# Patient Record
Sex: Female | Born: 1995 | Race: Asian | Hispanic: No | Marital: Single | Smoking: Never smoker
Health system: Southern US, Community
[De-identification: ages and names within clinical notes are randomized; demographics above are authoritative.]

## PROBLEM LIST (undated history)

## (undated) ENCOUNTER — Ambulatory Visit (HOSPITAL_COMMUNITY): Admission: EM | Discharge status: Absent without leave | Payer: Self-pay

## (undated) DIAGNOSIS — Z789 Other specified health status: Secondary | ICD-10-CM

## (undated) DIAGNOSIS — T7840XA Allergy, unspecified, initial encounter: Secondary | ICD-10-CM

## (undated) HISTORY — DX: Allergy, unspecified, initial encounter: T78.40XA

## (undated) HISTORY — PX: NO PAST SURGERIES: SHX2092

---
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
## 2017-07-18 ENCOUNTER — Other Ambulatory Visit: Payer: Self-pay | Admitting: Internal Medicine

## 2017-07-18 ENCOUNTER — Ambulatory Visit
Admission: RE | Admit: 2017-07-18 | Discharge: 2017-07-18 | Disposition: A | Discharge status: Home / Self Care | Payer: BLUE CROSS/BLUE SHIELD | Source: Ambulatory Visit | Attending: Internal Medicine | Admitting: Internal Medicine

## 2017-07-18 DIAGNOSIS — A159 Respiratory tuberculosis unspecified: Secondary | ICD-10-CM

## 2017-10-15 ENCOUNTER — Ambulatory Visit: Payer: BLUE CROSS/BLUE SHIELD | Admitting: Emergency Medicine

## 2017-10-15 ENCOUNTER — Ambulatory Visit: Payer: BLUE CROSS/BLUE SHIELD | Admitting: Urgent Care

## 2017-10-15 ENCOUNTER — Encounter: Payer: Self-pay | Admitting: Urgent Care

## 2017-10-15 VITALS — BP 98/62 | HR 79 | Ht 60.25 in | Wt 89.6 lb

## 2017-10-15 DIAGNOSIS — B354 Tinea corporis: Secondary | ICD-10-CM | POA: Diagnosis not present

## 2017-10-15 DIAGNOSIS — L299 Pruritus, unspecified: Secondary | ICD-10-CM | POA: Diagnosis not present

## 2017-10-15 MED ORDER — FLUCONAZOLE 150 MG PO TABS: 150.0000 mg | ORAL_TABLET | ORAL | 0 refills | Status: DC

## 2017-10-15 MED ORDER — CLOTRIMAZOLE-BETAMETHASONE 1-0.05 % EX CREA: 1.0000 "application " | TOPICAL_CREAM | Freq: Two times a day (BID) | CUTANEOUS | 0 refills | Status: DC

## 2017-10-15 NOTE — Patient Instructions (Signed)
Use the cream for 1 week. Take the oral medication once per week for 6 weeks. Please call and let me know if your rash does not get better. I will send you to a skin doctor in this case.     Body Ringworm Body ringworm is an infection of the skin that often causes a ring-shaped rash. Body ringworm can affect any part of your skin. It can spread easily to others. Body ringworm is also called tinea corporis. What are the causes? This condition is caused by funguses called dermatophytes. The condition develops when these funguses grow out of control on the skin. You can get this condition if you touch a person or animal that has it. You can also get it if you share clothing, bedding, towels, or any other object with an infected person or pet. What increases the risk? This condition is more likely to develop in:  Athletes who often make skin-to-skin contact with other athletes, such as wrestlers.  People who share equipment and mats.  People with a weakened immune system.  What are the signs or symptoms? Symptoms of this condition include:  Itchy, raised red spots and bumps.  Red scaly patches.  A ring-shaped rash. The rash may have: ? A clear center. ? Scales or red bumps at its center. ? Redness near its borders. ? Dry and scaly skin on or around it.  How is this diagnosed? This condition can usually be diagnosed with a skin exam. A skin scraping may be taken from the affected area and examined under a microscope to see if the fungus is present. How is this treated? This condition may be treated with:  An antifungal cream or ointment.  An antifungal shampoo.  Antifungal medicines. These may be prescribed if your ringworm is severe, keeps coming back, or lasts a long time.  Follow these instructions at home:  Take over-the-counter and prescription medicines only as told by your health care provider.  If you were given an antifungal cream or ointment: ? Use it as told by  your health care provider. ? Wash the infected area and dry it completely before applying the cream or ointment.  If you were given an antifungal shampoo: ? Use it as told by your health care provider. ? Leave the shampoo on your body for 3-5 minutes before rinsing.  While you have a rash: ? Wear loose clothing to stop clothes from rubbing and irritating it. ? Wash or change your bed sheets every night.  If your pet has the same infection, take your pet to see a International aid/development workerveterinarian. How is this prevented?  Practice good hygiene.  Wear sandals or shoes in public places and showers.  Do not share personal items with others.  Avoid touching red patches of skin on other people.  Avoid touching pets that have bald spots.  If you touch an animal that has a bald spot, wash your hands. Contact a health care provider if:  Your rash continues to spread after 7 days of treatment.  Your rash is not gone in 4 weeks.  The area around your rash gets red, warm, tender, and swollen. This information is not intended to replace advice given to you by your health care provider. Make sure you discuss any questions you have with your health care provider. Document Released: 10/19/2000 Document Revised: 03/29/2016 Document Reviewed: 08/18/2015 Elsevier Interactive Patient Education  [employer]2018 Elsevier Inc.

## 2017-10-15 NOTE — Progress Notes (Signed)
  MRN: [id number] DOB: 02/02/1996  Subjective:   Tricia Wood is a 21 y.o. female presenting for 2 month history of recurrent itchy rash over neck, arms, torso, lower legs. Patient used an otc antifungal cream with some relief but not resolution of her symptoms. Denies fever, pain, drainage of pus or bleeding. Has a history of tinea corporis, treated with antifungal medications which resolved her rash.    Tricia Wood has a current medication list which includes the following prescription(s): rifampin. Also is allergic to chicken allergy and shrimp [shellfish allergy].  Tricia Wood  has a past medical history of Allergy. Also  has no past surgical history on file.  Objective:   Vitals: BP 98/62 (BP Location: Left Arm, Patient Position: Sitting, Cuff Size: Normal)   Pulse 79   Ht 5' 0.25" (1.53 m)   Wt 89 lb 9.6 oz (40.6 kg)   SpO2 98%   BMI 17.35 kg/m   Physical Exam  Constitutional: She is oriented to person, place, and time. She appears well-developed and well-nourished.  HENT:  Mouth/Throat: Oropharynx is clear and moist.  Cardiovascular: Normal rate.  Pulmonary/Chest: Effort normal.  Neurological: She is alert and oriented to person, place, and time.  Skin: Skin is warm and dry. Rash (multiple dry hyperpigmented scaly lesions without central clearing over wrists, forearms, anterior neck, lower legs) noted.  Psychiatric: She has a normal mood and affect.    Assessment and Plan :   1. Tinea corporis 2. Itching - Will cover for fungal infection with fluconazole, clotrimazole-betamethasone. Patient instructed to use cream for 1 week, oral treatment for 6 weeks, once weekly. Return-to-clinic precautions discussed, patient verbalized understanding. She is to let me know if this does not resolve her rash, will pursue referral to derm at that point. Patient is in agreement with our treatment plan.  Tricia BambergMario Else Habermann, PA-C Primary Care at [facility] [phone] 10/15/2017  11:33 AM

## 2017-12-31 ENCOUNTER — Ambulatory Visit: Payer: BLUE CROSS/BLUE SHIELD | Admitting: Urgent Care

## 2017-12-31 ENCOUNTER — Encounter: Payer: Self-pay | Admitting: Urgent Care

## 2017-12-31 VITALS — BP 106/74 | HR 67 | Temp 98.2°F | Resp 16 | Ht 60.0 in | Wt 86.4 lb

## 2017-12-31 DIAGNOSIS — R21 Rash and other nonspecific skin eruption: Secondary | ICD-10-CM

## 2017-12-31 DIAGNOSIS — L299 Pruritus, unspecified: Secondary | ICD-10-CM | POA: Diagnosis not present

## 2017-12-31 MED ORDER — HYDROXYZINE PAMOATE 25 MG PO CAPS: 25.0000 mg | ORAL_CAPSULE | Freq: Three times a day (TID) | ORAL | 1 refills | Status: DC | PRN

## 2017-12-31 NOTE — Progress Notes (Signed)
  MRN: [id number] DOB: 1996-07-01  Subjective:   Tricia Wood is a 22 y.o. female presenting for recurrent rash. Has been to our clinic twice previously for similar rash. Has been treated with partial successful for fungal infection. Today, reports 4 month history of very itchy rash over same areas including lower legs, torso, arms, back. She has finished course of diflucan, clotrimazole-betamethasone and still has this rash. Denies that it is worse but not any better. Denies smoking cigarettes or drinking alcohol.   Tricia Wood has a current medication list which includes the following prescription(s): clotrimazole-betamethasone, fluconazole, and rifampin. Also is allergic to chicken allergy and shrimp [shellfish allergy].  Tricia Wood  has a past medical history of Allergy. Denies past surgical history.  Objective:   Vitals: BP 106/74   Pulse 67   Temp 98.2 F (36.8 C) (Oral)   Resp 16   Ht 5' (1.524 m)   Wt 86 lb 6.4 oz (39.2 kg)   SpO2 98%   BMI 16.87 kg/m   Physical Exam  Constitutional: She is oriented to person, place, and time. She appears well-developed and well-nourished.  HENT:  Mouth/Throat: Oropharynx is clear and moist.  Eyes: No scleral icterus.  Cardiovascular: Normal rate.  Pulmonary/Chest: Effort normal.  Neurological: She is alert and oriented to person, place, and time.  Skin: Rash (multiple patches of dry scaly hyperpigmentation of varying sizes from 1/2cm-2cm over right lower leg, upper extremities, torso and back; there are also multiple associated excoriations from her itching) noted.  Psychiatric: She has a normal mood and affect.   Assessment and Plan :   Rash and nonspecific skin eruption - Plan: Ambulatory referral to Dermatology  Itching - Plan: Ambulatory referral to Dermatology  Will refer to dermatology for consult. In the meantime, start hydroxyzine to help with itching.   Tricia BambergMario Herberto Ledwell, PA-C Primary Care at [facility] [phone] 12/31/2017   9:25 AM

## 2017-12-31 NOTE — Patient Instructions (Addendum)
Pht ban Rash Pht ban l s? thay ??i mu s?c c?a da. Pht ban c?ng c th? lm thay ??i c?m gic da. C nhi?u tnh tr?ng b?nh l v y?u t? khc nhau c th? gy pht ban. Tun th? nh?ng h??ng d?n ny ? nh: Ch  ??n b?t c? thay ??i no v? tri?u ch?ng c?a qu v?. Lm theo nh?ng h??ng d?n sau ?? gip c?i thi?n tnh tr?ng c?a qu v?: Thu?c Ch? dng ho?c bi thu?c khng k ??n v thu?c k ??n theo ch? d?n c?a chuyn gia ch?m [place] s?c kh?e. Nh?ng thu?c ny c th? bao g?m:  Thu?c corticosteroid d?ng kem.  Thu?c d?ng kem l?ng ch?ng ng?a.  Thu?c khng histamine ???ng u?ng.  Ch?m so?c da  Ch??m mt vo vng b? ?nh h??ng.  Th? t?m b?ng: ? Mu?i [place]. Lm theo nh?ng ch? d?n trn bao b. Qu v? c th? mua mu?i ny t?i nh thu?c ho?c c?a hng t?p ha ? ??a ph??ng. ? Thu?c mu?i. ?? m?t l??ng nh? vo b?n t?m theo ch? d?n c?a chuyn gia ch?m [place] s?c kh?e. ? Keo b?t y?n m?ch. Lm theo nh?ng ch? d?n trn bao b. Qu v? c th? mua t?i nh thu?c ho?c c?a hng t?p ha ? ??a ph??ng.  Th? bi thu?c mu?i d?ng b?t nho ln da qu v?. Tr?n n??c vo thu?c mu?i cho ??n khi thnh b?t nho.  Khng gi ho?c ch xt vo da qu v?.  Trnh che kn ch? pht ban. ??m b?o pht ban ti?p xc v?i khng kh cng nhi?u cng t?t. H??ng d?n chung  Trnh t?m vi hoa sen ho?c t?m b?n n??c nng v c th? khi?n tnh tr?ng ng?a t? h?n. T?m vi hoa sen n??c l?nh c th? c tc d?ng.  Trnh x bng, ch?t t?y r?a v n??c hoa c mi th?m. S? d?ng x bng, ch?t t?y r?a, n??c hoa v cc s?n ph?m m? ph?m khc thu?c lo?i nh?.  Trnh b?t k? ch?t no gy pht ban. Ghi nh?t k ?? giu?p quy? vi? theo di nh?ng nguyn nhn gy pht ban. Ghi l?i: ? Qu v? ?n g. ? Qu v? s? d?ng m? ph?m no. ? Qu v? u?ng g. ? Qu v? m?c ?? g. ?i?u ny bao g?m c? vi?c ?eo trang s?c.  Tun th? t?t c? cc l?n khm theo di theo ch? d?n c?a chuyn gia ch?m [place] s?c kh?e. ?i?u ny c vai tr quan tr?ng. Hy lin l?c v?i chuyn gia ch?m [place] s?c kh?e  n?u:  Qu v? ?? m? hi ban ?m.  Qu v? b? s?t cn.  Qu v? ?i ti?u nhi?u h?n bnh th??ng.  Qu v? c?m th?y y?u ?t.  Qu v? nn.  Da ho?c trng tr?ng m?t c?a qu v? c mu vng (b?nh vng da).  Da c?a Ladell Heads v?: ? ?au bu?t. ? T b.  Ch? pht ban c?a qu v?: ? Khng h?t sau vi ngy. ? Tr? nn tr?m tr?ng h?n.  Qu v?: ? Kht n??c b?t th??ng. ? M?t m?i nhi?u h?n bnh th??ng.  Qu v? bi?: ? Tri?u ch?ng m?i. ? ?au b?ng. ? S?t. ? Tiu ch?y. Yu c?u tr? gip ngay l?p t?c n?u:  Qu v? b? pht ban bao ph? ton b? ho?c g?n nh? ton b? c? th?. Pht ban c th? ?au ho?c khng ?au.  Qu v? c cc m?n n??c: ? N?m trn pht ban. ? To ra v t?o thnh c?m. ? ?au. ?  Bn trong mi?ng v m?i.  Qu v? b? pht ban: ? Trng gi?ng nh? cc ??m mu ?? ta c? b?ng v?t chm kim trn kh?p c? th?. ? C m?t "?i?m ?en" ho?c trng nh? m?t hnh bia. ? Khng lin quan ??n vi?c ph?i n?ng, ?? v ?au v khi?n da b? trc. Thng tin ny khng nh?m m?c ?ch thay th? cho l?i khuyn m chuyn gia ch?m [place] s?c kh?e ni v?i qu v?. Hy b?o ??m qu v? ph?i th?o lu?n b?t k? v?n ?? g m qu v? c v?i chuyn gia ch?m [place] s?c kh?e c?a qu v?. Document Released: 01/14/2012 Document Revised: 02/07/2017 Document Reviewed: 03/09/2015 Elsevier Interactive Patient Education  2018 Elsevier Inc.     Hydroxyzine capsules or tablets ?y l thu?c g? HYDROXYZINE l thu?c khng histamine. Thu?c ny ???c dng ?? ?i?u tr? cc tri?u ch?ng d? ?ng. N c?ng ???c dng ?? ?i?u tr? tnh tr?ng lo u v c?ng th?ng. Thu?c ny c th? ???c dng cng v?i cc thu?c khc ?? gy ng? tr??c khi gi?i ph?u. Thu?c ny c th? ???c dng cho nh?ng m?c ?ch khc; hy h?i ng??i cung c?p d?ch v? y t? ho?c d??c s? c?a mnh, n?u qu v? c th?c m?c. (CC) NHN HI?U PH? BI?N: ANX, Atarax, Rezine, Vistaril Ti c?n ph?i bo cho ng??i cung c?p d?ch v? y t? c?a mnh ?i?u g tr??c khi dng thu?c ny? H? c?n bi?t li?u qu v? hi?n c b?t k? tnh tr?ng no sau  ?y hay khng: -b?t k? b?nh mn tnh no -kh ?i ti?u -b?nh t?ng nhn p -b?nh tim -b?nh th?n -b?nh gan -b?nh ph?i -ph?n ?ng b?t th??ng ho?c d? ?ng v??i hydroxyzine ho?c cetirizine -pha?n ??ng b?t th???ng ho??c di? ??ng v??i ca?c d??c ph?m kha?c, th?c ph?m, thu?c nhu?m, ho??c ch?t ba?o qua?n -?ang c thai ho??c ??nh co? thai -?ang cho con bu? Ti nn s? d?ng thu?c ny nh? th? no? U?ng thu?c ny v?i m?t [place] n??c ??y. Hy lm theo cc h??ng d?n trn h?p thu?c ho?c nhn thu?c. Qu v? c th? u?ng thu?c ny cng v?i th?c ?n ho?c khi bao t? ?ang tr?ng. Dng thu?c ny vo nh?ng kho?ng th?i gian ??u nhau. Khng ???c dng thu?c ny nhi?u l?n h?n ? ???c ch? d?n. Hy bn v?i bc s? nhi khoa c?a qu v? v? vi?c dng thu?c ny ? tr? em. C th? c?n ch?m [place] ??c bi?t. Thu?c ny c th? ???c k toa cho tr? em ch? m?i 6 tu?i trong nh?ng tr??ng h?p ch?n l?c, nh?ng c?n ph?i th?n tr?ng. Nh?ng b?nh nhn trn 65 tu?i c th? c ph?n ?ng m?nh h?n v?i thu?c ny v c?n nh?ng l??ng phun nh? h?n. Qu li?u: N?u qu v? cho r?ng mnh ? dng qu nhi?u thu?c ny, th hy lin l?c v?i trung tm ki?m sot ch?t ??c ho?c phng c?p c?u ngay l?p t?c. L?U : Thu?c ny ch? dnh ring cho qu v?. Khng chia s? thu?c ny v?i nh?ng ng??i khc. N?u ti l? qun m?t li?u th sao? N?u qu v? l? qun m?t li?u thu?c, hy dng li?u thu?c ? ngay khi c th?. N?u h?u nh? ? ??n gi? dng li?u thu?c k? ti?p, th ch? dng li?u thu?c k? ti?p ?Marland Kitchen Khng ???c dng li?u g?p ?i ho?c dng thm li?u. Nh?ng g c th? t??ng tc v?i thu?c ny? -r??u -cc thu?c nhm barbiturate dng ?? gy ng? ho?c ?? ?i?u tr? cc ch?ng co gi?t -cc thu?c  dng cho cc ch?ng c?m l?nh ho?c d? ?ng -cc thu?c dng cho ch?ng tr?m c?m, ch?ng lo u, ho?c cc r?i nhi?u c?m xc -cc thu?c gi?m ?au -cc thu?c ng? -cc thu?c lm gin c? Danh sch ny c th? khng m t? ?? h?t cc t??ng tc c th? x?y ra. Hy ??a cho ng??i cung c?p d?ch v? y t? c?a mnh danh sch t?t c? cc  thu?c, th?o d??c, cc thu?c khng c?n toa, ho?c cc ch? ph?m b? sung m qu v? dng. C?ng nn bo cho h? bi?t r?ng qu v? c ht thu?c, u?ng r??u, ho?c c s? d?ng ma ty tri php hay khng. Vi th? c th? t??ng tc v?i thu?c c?a qu v?. Ti c?n ph?i theo di ?i?u g trong khi dng thu?c ny? Hy bo cho bc s? ho?c chuyn vin y t?, n?u cc tri?u ch?ng c?a mnh khng kh h?n. Qu v? c th? b? bu?n ng? ho?c chng m?t. Khng ???c li xe, s? d?ng my mc, ho?c lm nh?ng vi?c c?n ph?i t?nh to cho t?i khi qu v? bi?t ???c thu?c ny ?nh h??ng ln qu v? nh? th? no. Khng ???c ng?i d?y ho?c ??ng d?y nhanh, ??c bi?t l khi qu v? l b?nh nhn l?n tu?i. ?i?u ny lm gi?m nguy c? b? chng m?t ho?c ng?t x?u. R??u c th? c?n tr? tc d?ng c?a thu?c ny. Hy trnh cc ?? u?ng c r??u. Qu v? c th? b? kh mi?ng. Nhai k?o cao su (chewing gum) khng c ???ng ho?c ng?m k?o c?ng, v u?ng nhi?u n??c c th? c ch. Hy lin l?c v?i bc s?, n?u v?n ?? nghim tr?ng ho?c khng ch?m d?t. Thu?c ny c th? gy kh m?t v nhn m?. N?u qu v? mang knh p trng, th qu v? c th? c?m th?y kh ch?u ?i cht. Thu?c bi tr?n d?ng nh? gi?t c th? c ch. Hy ??n g?p bc s?, n?u v?n ?? nghim tr?ng ho?c khng ch?m d?t. N?u qu v? s?p ???c lm xt nghi?m d? ?ng trn da, th hy bo cho bc s? bi?t r?ng qu v? ?ang dng thu?c ny. Ti c th? nh?n th?y nh?ng tc d?ng ph? no khi dng thu?c ny? Nh?ng tc d?ng ph? qu v? c?n ph?i bo cho bc s? ho?c chuyn vin y t? cng s?m cng t?t: -tim ??p nhanh ho?c khng ??u -kh ?i ti?u -co gi?t -ni lu gi?ng ho?c l l?n -run r?y Cc tc d?ng ph? khng c?n ph?i ch?m [place] y t? (hy bo cho bc s? ho?c chuyn vin y t?, n?u cc tc d?ng ph? ny ti?p di?n ho?c gy phi?n toi): -to bn -bu?n ng? -m?t m?i -?au ??u -kh ch?u ? bao t? Danh sch ny c th? khng m t? ?? h?t cc tc d?ng ph? c th? x?y ra. Xin g?i t?i bc s? c?a mnh ?? ???c c? v?n chuyn mn v? cc tc d?ng ph?Ladell Heads v? c th?  t??ng trnh cc tc d?ng ph? cho FDA theo s? [phone]. Ti nn c?t gi? thu?c c?a mnh ? ?u? ?? ngoi t?m tay tr? em. C?t gi? ? nhi?t ?? phng t? 15 ??n 30 ?? C (61 ??n 88 ?? F). ?ng ch?t gi/h?p thu?c. V?t b? t?t c? thu?c ch?a dng sau ngy h?t h?n in trn nhn thu?c ho?c bao thu?c. L?U : ?y l b?n tm t?t. N c th? khng bao hm t?t c? thng tin c th? c. N?u qu v? th?c m?c v?  thu?c ny, xin trao ??i v?i bc s?, d??c s?, ho?c ng??i cung c?p d?ch v? y t? c?a mnh.  2018 Elsevier/Gold Standard (2009-09-23 00:00:00)     IF you received an x-ray today, you will receive an invoice from [facility]. Please contact [facility] at [phone] with questions or concerns regarding your invoice.   IF you received labwork today, you will receive an invoice from [facility]. Please contact [facility] at [phone] with questions or concerns regarding your invoice.   Our billing staff will not be able to assist you with questions regarding bills from these companies.  You will be contacted with the lab results as soon as they are available. The fastest way to get your results is to activate your My Chart account. Instructions are located on the last page of this paperwork. If you have not heard from us regarding the results in 2 weeks, please contact this office.

## 2019-11-24 ENCOUNTER — Other Ambulatory Visit: Payer: BLUE CROSS/BLUE SHIELD

## 2019-11-24 ENCOUNTER — Ambulatory Visit: Payer: BLUE CROSS/BLUE SHIELD | Attending: Internal Medicine

## 2019-11-24 DIAGNOSIS — Z20822 Contact with and (suspected) exposure to covid-19: Secondary | ICD-10-CM | POA: Insufficient documentation

## 2019-11-25 LAB — NOVEL CORONAVIRUS, NAA: SARS-CoV-2, NAA: NOT DETECTED

## 2020-06-18 ENCOUNTER — Other Ambulatory Visit: Payer: Self-pay

## 2020-06-18 ENCOUNTER — Ambulatory Visit (HOSPITAL_COMMUNITY)
Admission: EM | Admit: 2020-06-18 | Discharge: 2020-06-18 | Disposition: A | Discharge status: Home / Self Care | Payer: BLUE CROSS/BLUE SHIELD

## 2020-06-18 ENCOUNTER — Ambulatory Visit (INDEPENDENT_AMBULATORY_CARE_PROVIDER_SITE_OTHER): Payer: BLUE CROSS/BLUE SHIELD

## 2020-06-18 ENCOUNTER — Encounter (HOSPITAL_COMMUNITY): Payer: Self-pay

## 2020-06-18 DIAGNOSIS — M7662 Achilles tendinitis, left leg: Secondary | ICD-10-CM | POA: Diagnosis not present

## 2020-06-18 DIAGNOSIS — M7660 Achilles tendinitis, unspecified leg: Secondary | ICD-10-CM

## 2020-06-18 DIAGNOSIS — M25572 Pain in left ankle and joints of left foot: Secondary | ICD-10-CM

## 2020-06-18 DIAGNOSIS — M25472 Effusion, left ankle: Secondary | ICD-10-CM

## 2020-06-18 MED ORDER — NAPROXEN 375 MG PO TABS: 375.0000 mg | ORAL_TABLET | Freq: Two times a day (BID) | ORAL | 0 refills | Status: DC

## 2020-06-18 NOTE — ED Provider Notes (Signed)
[facility]   MRN: [id number] DOB: 1996-04-08  Subjective:   Tricia Wood is a 24 y.o. female presenting for acute onset this morning of swelling over the back of her left ankle.  Patient also endorses moderate to severe pain.  Has had difficulty walking as a result, is limping.  Has not taken any medication for pain relief.  Denies fall, trauma, twisting her ankle.  No current facility-administered medications for this encounter.  Current Outpatient Medications:  .  Ascorbic Acid (VITAMIN C) 1000 MG tablet, Take 1,000 mg by mouth daily., Disp: , Rfl:  .  hydrOXYzine (VISTARIL) 25 MG capsule, Take 1 capsule (25 mg total) by mouth 3 (three) times daily as needed., Disp: 30 capsule, Rfl: 1   Allergies  Allergen Reactions  . Chicken Allergy Hives  . Shrimp [Shellfish Allergy] Hives    Past Medical History:  Diagnosis Date  . Allergy      History reviewed. No pertinent surgical history.  No family history on file.  Social History   Tobacco Use  . Smoking status: Never Smoker  . Smokeless tobacco: Never Used  Substance Use Topics  . Alcohol use: No    Alcohol/week: 0.0 standard drinks  . Drug use: No    ROS   Objective:   Vitals: BP 101/61   Pulse 76   Temp 98.2 F (36.8 C) (Oral)   Resp 16   Ht 5' (1.524 m)   Wt 95 lb (43.1 kg)   SpO2 100%   BMI 18.55 kg/m   Physical Exam Constitutional:      General: She is not in acute distress.    Appearance: Normal appearance. She is well-developed. She is not ill-appearing, toxic-appearing or diaphoretic.  HENT:     Head: Normocephalic and atraumatic.     Nose: Nose normal.     Mouth/Throat:     Mouth: Mucous membranes are moist.     Pharynx: Oropharynx is clear.  Eyes:     General: No scleral icterus.       Right eye: No discharge.        Left eye: No discharge.     Extraocular Movements: Extraocular movements intact.     Conjunctiva/sclera: Conjunctivae normal.     Pupils: Pupils are equal, round, and  reactive to light.  Cardiovascular:     Rate and Rhythm: Normal rate.  Pulmonary:     Effort: Pulmonary effort is normal.  Musculoskeletal:     Left ankle: Swelling (1+ posteriorly) present. No deformity, ecchymosis or lacerations. Tenderness (directly over mid-lower portion of Achilles tendon) present. No lateral malleolus, medial malleolus, ATF ligament, AITF ligament, CF ligament, posterior TF ligament, base of 5th metatarsal or proximal fibula tenderness. Normal range of motion.     Left Achilles Tendon: Tenderness present. No defects. Thompson's test negative.  Skin:    General: Skin is warm and dry.  Neurological:     General: No focal deficit present.     Mental Status: She is alert and oriented to person, place, and time.     Motor: No weakness.     Coordination: Coordination normal.     Gait: Gait normal.     Deep Tendon Reflexes: Reflexes normal.  Psychiatric:        Mood and Affect: Mood normal.        Behavior: Behavior normal.        Thought Content: Thought content normal.        Judgment: Judgment normal.  DG Ankle Complete Left  Result Date: 06/18/2020 CLINICAL DATA:  Left Achilles pain. EXAM: LEFT ANKLE COMPLETE - 3+ VIEW COMPARISON:  None. FINDINGS: There is no evidence of fracture, dislocation, or joint effusion. There is no evidence of arthropathy or other focal bone abnormality. Soft tissues are unremarkable. IMPRESSION: No acute osseous injury of the left ankle. Electronically Signed   By: Elige Ko   On: 06/18/2020 12:56    Assessment and Plan :   PDMP not reviewed this encounter.  1. Tendonitis, Achilles, left   2. Pain in Achilles tendon   3. Left ankle swelling     We will manage for Achilles tendinitis with conservative management using ASO ankle wrap, naproxen, crutches.  Recommended follow-up with orthopedist if her symptoms persist.  Counseled patient on potential for adverse effects with medications prescribed/recommended today, ER and  return-to-clinic precautions discussed, patient verbalized understanding.    Wallis Bamberg, [place] 06/18/20 1305

## 2020-06-18 NOTE — ED Triage Notes (Signed)
Pt c/o 5/10 pain in left ankle upon waking today. Pt denies injury. Pt denies numbness and tingling. Pt limped to exam room. Pt has 1+ swelling of posterior of ankle.Pt has 2+ left pedal pulse, cap refill less than 3 sec, pt has full ROm of ankle.

## 2021-03-28 ENCOUNTER — Ambulatory Visit (HOSPITAL_COMMUNITY)
Admission: RE | Admit: 2021-03-28 | Discharge: 2021-03-28 | Disposition: A | Discharge status: Home / Self Care | Payer: 59 | Source: Ambulatory Visit | Attending: Family Medicine | Admitting: Family Medicine

## 2021-03-28 ENCOUNTER — Encounter (HOSPITAL_COMMUNITY): Payer: Self-pay

## 2021-03-28 ENCOUNTER — Other Ambulatory Visit: Payer: Self-pay

## 2021-03-28 VITALS — BP 97/65 | HR 86 | Temp 98.5°F | Resp 16

## 2021-03-28 DIAGNOSIS — H1013 Acute atopic conjunctivitis, bilateral: Secondary | ICD-10-CM

## 2021-03-28 DIAGNOSIS — H01134 Eczematous dermatitis of left upper eyelid: Secondary | ICD-10-CM

## 2021-03-28 DIAGNOSIS — H01132 Eczematous dermatitis of right lower eyelid: Secondary | ICD-10-CM | POA: Diagnosis not present

## 2021-03-28 DIAGNOSIS — H01135 Eczematous dermatitis of left lower eyelid: Secondary | ICD-10-CM

## 2021-03-28 DIAGNOSIS — H01131 Eczematous dermatitis of right upper eyelid: Secondary | ICD-10-CM

## 2021-03-28 MED ORDER — PREDNISONE 20 MG PO TABS: 40.0000 mg | ORAL_TABLET | Freq: Every day | ORAL | 0 refills | Status: DC

## 2021-03-28 MED ORDER — AZELASTINE HCL 0.05 % OP SOLN: 1.0000 [drp] | Freq: Two times a day (BID) | OPHTHALMIC | 2 refills | Status: AC

## 2021-03-28 MED ORDER — CETIRIZINE HCL 10 MG PO TABS: 10.0000 mg | ORAL_TABLET | Freq: Every day | ORAL | 2 refills | Status: DC

## 2021-03-28 MED ORDER — HYDROCORTISONE 1 % EX CREA: TOPICAL_CREAM | CUTANEOUS | 0 refills | Status: DC

## 2021-03-28 NOTE — ED Provider Notes (Signed)
[facility]    CSN: [id number] Arrival date & time: 03/28/21  0856      History   Chief Complaint Chief Complaint  Patient presents with  . itching to both eye lids     HPI Tricia Wood is a 25 y.o. female.   Patient here today with 1 month history of itching, redness, irritation around both eyes and clear watery discharge and itching worse at nighttime to the eyes themselves.  She states sometimes the Pataday eyedrops relieve this for short periods of time but it never goes fully away.  Did try some Zyrtec at one point which helped with her sneezing and runny nose but did not help with her eyes.  Denies visual changes, headaches, nausea, vomiting, sinus pain or pressure.  Does not have a known history of eczema or other skin conditions but does get dry patches on her shoulders and arms at times.  Not trying anything over-the-counter topically.    Past Medical History:  Diagnosis Date  . Allergy     Patient Active Problem List   Diagnosis Date Noted  . Hearing loss of right ear due to cerumen impaction 11/12/2016    History reviewed. No pertinent surgical history.  OB History   No obstetric history on file.      Home Medications    Prior to Admission medications   Medication Sig Start Date End Date Taking? Authorizing Provider  azelastine (OPTIVAR) 0.05 % ophthalmic solution Place 1 drop into both eyes 2 (two) times daily. 03/28/21  Yes Particia Nearing, PA-C  cetirizine (ZYRTEC ALLERGY) 10 MG tablet Take 1 tablet (10 mg total) by mouth daily. 03/28/21  Yes Particia Nearing, PA-C  hydrocortisone cream 1 % Apply to affected area 2 times daily 03/28/21  Yes Particia Nearing, PA-C  predniSONE (DELTASONE) 20 MG tablet Take 2 tablets (40 mg total) by mouth daily with breakfast. 03/28/21  Yes Particia Nearing, PA-C  Ascorbic Acid (VITAMIN C) 1000 MG tablet Take 1,000 mg by mouth daily.    [provider]  hydrOXYzine (VISTARIL) 25 MG  capsule Take 1 capsule (25 mg total) by mouth 3 (three) times daily as needed. 12/31/17   Wallis Bamberg, PA-C  naproxen (NAPROSYN) 375 MG tablet Take 1 tablet (375 mg total) by mouth 2 (two) times daily with a meal. 06/18/20   Wallis Bamberg, PA-C    Family History History reviewed. No pertinent family history.  Social History Social History   Tobacco Use  . Smoking status: Never Smoker  . Smokeless tobacco: Never Used  Substance Use Topics  . Alcohol use: No    Alcohol/week: 0.0 standard drinks  . Drug use: No     Allergies   Chicken allergy and Shrimp [shellfish allergy]   Review of Systems Review of Systems Per HPI  Physical Exam Triage Vital Signs ED Triage Vitals  Enc Vitals Group     BP 03/28/21 0911 97/65     Pulse Rate 03/28/21 0911 86     Resp 03/28/21 0911 16     Temp 03/28/21 0911 98.5 F (36.9 C)     Temp src --      SpO2 03/28/21 0911 100 %     Weight --      Height --      Head Circumference --      Peak Flow --      Pain Score 03/28/21 0913 0     Pain Loc --  Pain Edu? --      Excl. in GC? --    No data found.  Updated Vital Signs BP 97/65   Pulse 86   Temp 98.5 F (36.9 C)   Resp 16   LMP 02/07/2021   SpO2 100%   Visual Acuity Right Eye Distance:   Left Eye Distance:   Bilateral Distance:    Right Eye Near:   Left Eye Near:    Bilateral Near:     Physical Exam Vitals and nursing note reviewed.  Constitutional:      Appearance: Normal appearance. She is not ill-appearing.  HENT:     Head: Atraumatic.     Nose: Nose normal.     Mouth/Throat:     Mouth: Mucous membranes are moist.     Pharynx: Oropharynx is clear.  Eyes:     Extraocular Movements: Extraocular movements intact.     Conjunctiva/sclera: Conjunctivae normal.     Pupils: Pupils are equal, round, and reactive to light.     Comments: No current conjunctival injection or erythema bilaterally, no discharge at this time.   Cardiovascular:     Rate and Rhythm: Normal  rate and regular rhythm.     Heart sounds: Normal heart sounds.  Pulmonary:     Effort: Pulmonary effort is normal.     Breath sounds: Normal breath sounds.  Musculoskeletal:        General: Normal range of motion.     Cervical back: Normal range of motion and neck supple.  Skin:    General: Skin is warm and dry.     Findings: Rash present.     Comments: Bilateral upper and lower eyelids mildly erythematous, dry appearing, mild edema in this area particularly under the eyes  Neurological:     Mental Status: She is alert and oriented to person, place, and time.  Psychiatric:        Mood and Affect: Mood normal.        Thought Content: Thought content normal.        Judgment: Judgment normal.      UC Treatments / Results  Labs (all labs ordered are listed, but only abnormal results are displayed) Labs Reviewed - No data to display  EKG   Radiology No results found.  Procedures Procedures (including critical care time)  Medications Ordered in UC Medications - No data to display  Initial Impression / Assessment and Plan / UC Course  I have reviewed the triage vital signs and the nursing notes.  Pertinent labs & imaging results that were available during my care of the patient were reviewed by me and considered in my medical decision making (see chart for details).     Suspect combination of allergic conjunctivitis and eye dermatitis from eczema.  We will treat with short prednisone burst for immediate relief, start allergy regimen daily of Zyrtec, Optivar drops and use a mixture of 1% hydrocortisone cream mixed with a moisturizing cream twice daily as needed for relief of the areas of eczema.  Discussed very judicious use of this due to the sensitive nature of the skin in this area.  Follow-up with PCP if not fully resolving.  Final Clinical Impressions(s) / UC Diagnoses   Final diagnoses:  Allergic conjunctivitis of both eyes  Eczematous dermatitis of upper and lower  eyelids of both eyes     Discharge Instructions     For the area around your eyes that is itchy and irritated, you may mix the  hydrocortisone cream with an unscented moisturizing cream and apply twice daily as needed.  Take the Zyrtec daily and use the Optivar eyedrops twice daily.    ED Prescriptions    Medication Sig Dispense Auth. Provider   cetirizine (ZYRTEC ALLERGY) 10 MG tablet Take 1 tablet (10 mg total) by mouth daily. 30 tablet Particia Nearing, [place]   azelastine (OPTIVAR) 0.05 % ophthalmic solution Place 1 drop into both eyes 2 (two) times daily. 6 mL Particia Nearing, [place]   hydrocortisone cream 1 % Apply to affected area 2 times daily 15 g Particia Nearing, PA-C   predniSONE (DELTASONE) 20 MG tablet Take 2 tablets (40 mg total) by mouth daily with breakfast. 6 tablet Particia Nearing, [place]     PDMP not reviewed this encounter.   Particia Nearing, [place] 03/28/21 1005

## 2021-03-28 NOTE — Discharge Instructions (Addendum)
For the area around your eyes that is itchy and irritated, you may mix the hydrocortisone cream with an unscented moisturizing cream and apply twice daily as needed.  Take the Zyrtec daily and use the Optivar eyedrops twice daily.

## 2021-03-28 NOTE — ED Triage Notes (Signed)
Pt reports itching to bil.eye lids and burning to lower eye lids. No drainage .

## 2021-03-31 ENCOUNTER — Ambulatory Visit (HOSPITAL_BASED_OUTPATIENT_CLINIC_OR_DEPARTMENT_OTHER): Payer: BLUE CROSS/BLUE SHIELD | Admitting: Family Medicine

## 2021-04-10 ENCOUNTER — Other Ambulatory Visit: Payer: Self-pay | Admitting: Family Medicine

## 2021-04-14 ENCOUNTER — Ambulatory Visit (HOSPITAL_BASED_OUTPATIENT_CLINIC_OR_DEPARTMENT_OTHER): Payer: Self-pay | Admitting: Family Medicine

## 2021-05-15 ENCOUNTER — Encounter: Payer: Self-pay | Admitting: Allergy

## 2021-05-15 ENCOUNTER — Other Ambulatory Visit: Payer: Self-pay

## 2021-05-15 ENCOUNTER — Ambulatory Visit: Payer: 59 | Admitting: Allergy

## 2021-05-15 VITALS — BP 96/60 | HR 83 | Temp 98.5°F | Resp 14 | Ht 61.0 in | Wt 92.2 lb

## 2021-05-15 DIAGNOSIS — L282 Other prurigo: Secondary | ICD-10-CM | POA: Diagnosis not present

## 2021-05-15 DIAGNOSIS — T781XXD Other adverse food reactions, not elsewhere classified, subsequent encounter: Secondary | ICD-10-CM | POA: Insufficient documentation

## 2021-05-15 DIAGNOSIS — R21 Rash and other nonspecific skin eruption: Secondary | ICD-10-CM | POA: Diagnosis not present

## 2021-05-15 DIAGNOSIS — J310 Chronic rhinitis: Secondary | ICD-10-CM | POA: Diagnosis not present

## 2021-05-15 MED ORDER — CETIRIZINE HCL 10 MG PO TABS: 10.0000 mg | ORAL_TABLET | Freq: Two times a day (BID) | ORAL | 3 refills | Status: AC

## 2021-05-15 NOTE — Assessment & Plan Note (Signed)
Possibly noted worsening symptoms after eating chicken shrimp and beef however the symptoms occur overnight.  She is able to tolerate chicken and shrimp and beef in [place] with no issues.  Today's skin testing showed: Negative to select foods. . Monitor symptoms after eating chicken, beef and shrimp.  o Based on clinical history and symptoms, I doubt that these foods are causing her pruritic rash.  . For mild symptoms you can take over the counter antihistamines such as Benadryl and monitor symptoms closely. If symptoms worsen or if you have severe symptoms including breathing issues, throat closure, significant swelling, whole body hives, severe diarrhea and vomiting, lightheadedness then seek immediate medical care.

## 2021-05-15 NOTE — Patient Instructions (Addendum)
Today's skin testing showed: Negative to indoor/outdoor allergens and select foods. Results given.   Skin Not sure what's cause your symptoms. See below for proper skin care - use fragrance free and dye free products.  Start zyrtec (cetirizine) 10mg  1 to 2 times per day. Avoid the following potential triggers: alcohol, tight clothing, NSAIDs, hot showers and getting overheated. Get bloodwork:  We are ordering labs, so please allow 1-2 weeks for the results to come back. With the newly implemented Cures Act, the labs might be visible to you at the same time that they become visible to me. However, I will not address the results until all of the results are back, so please be patient.   If no improvement then recommend patch testing next. Patches are best placed on Monday with return to office on Wednesday and Friday of same week for readings.  Patches once placed should not get wet.  You do not have to stop any medications for patch testing but should not be on oral prednisone. You can schedule a patch testing visit when convenient for your schedule.    Eyes  Use Optivar (azelastine) eye drops 0.05% twice a day as needed for itchy/watery eyes.  Food: Monitor symptoms after eating chicken, beef and shrimp.  For mild symptoms you can take over the counter antihistamines such as Benadryl and monitor symptoms closely. If symptoms worsen or if you have severe symptoms including breathing issues, throat closure, significant swelling, whole body hives, severe diarrhea and vomiting, lightheadedness then seek immediate medical care.  Follow up in 2 months or sooner if needed.   Recommend establishing care with a PCP.   Skin care recommendations  Bath time: Always use lukewarm water. AVOID very hot or cold water. Keep bathing time to 5-10 minutes. Do NOT use bubble bath. Use a mild soap and use just enough to wash the dirty areas. Do NOT scrub skin vigorously.  After bathing, pat dry your  skin with a towel. Do NOT rub or scrub the skin.  Moisturizers and prescriptions:  ALWAYS apply moisturizers immediately after bathing (within 3 minutes). This helps to lock-in moisture. Use the moisturizer several times a day over the whole body. Good summer moisturizers include: Aveeno, CeraVe, Cetaphil. Good winter moisturizers include: Aquaphor, Vaseline, Cerave, Cetaphil, Eucerin, Vanicream. When using moisturizers along with medications, the moisturizer should be applied about one hour after applying the medication to prevent diluting effect of the medication or moisturize around where you applied the medications. When not using medications, the moisturizer can be continued twice daily as maintenance.  Laundry and clothing: Avoid laundry products with added color or perfumes. Use unscented hypo-allergenic laundry products such as Tide free, Cheer free & gentle, and All free and clear.  If the skin still seems dry or sensitive, you can try double-rinsing the clothes. Avoid tight or scratchy clothing such as wool. Do not use fabric softeners or dyer sheets.

## 2021-05-15 NOTE — Assessment & Plan Note (Addendum)
Pruritic rash on the face and arms and chest.  No specific triggers noted but concerned about environmental and food allergies.  No recent blood work. Patient works as a Advertising account planner. . Not sure what's cause your symptoms. . See below for proper skin care - use fragrance free and dye free products.   Start zyrtec (cetirizine) 10mg  1 to 2 times per day.  Avoid the following potential triggers: alcohol, tight clothing, NSAIDs, hot showers and getting overheated. . Get bloodwork to rule out other etiologies.   If no improvement then recommend patch testing next - TRUE patch test.

## 2021-05-15 NOTE — Assessment & Plan Note (Signed)
Perennial rhinoconjunctivitis symptoms for many years with worsening in the spring.  Tried Zyrtec and azelastine items with good benefit.  No prior allergy evaluation.  Today's skin prick testing showed: Negative to indoor/outdoor allergens.  Get bloodwork to double check results as I'm already ordering bloodwork for her pruritic rash.   Start zyrtec (cetirizine) 10mg  1 to 2 times per day.  Use Optivar (azelastine) eye drops 0.05% twice a day as needed for itchy/watery eyes.

## 2021-05-15 NOTE — Progress Notes (Signed)
New Patient Note  RE: Tricia Wood MRN: [id number] DOB: 03-08-1996 Date of Office Visit: 05/15/2021  Consult requested by: No ref. provider found Primary care provider: Patient, No Pcp Per (Inactive)  Chief Complaint: Allergic Rhinitis  (Itching daily, face swelling, and grass on her body daily. )  History of Present Illness: I had the pleasure of seeing Tricia Wood for initial evaluation at the [facility] on 05/15/2021. She is a 25 y.o. female, who is self-referred here for the evaluation of allergies.  She reports symptoms of itchy skin especially on the face, some rash on the arms and chest, nasal congestion, rhinorrhea, sneezing, itchy/watery eyes. Symptoms have been going on for many years. The symptoms are present all year around with worsening in spring.  Anosmia: no. Headache: no. She has used zyrtec 10mg  daily, azelastine eye drops with fair improvement in symptoms. Sinus infections: none. Previous work up includes: none. Previous ENT evaluation: no. Previous sinus imaging: no. History of nasal polyps: no. Last eye exam: 1 year ago. History of reflux: no.  Currently using dove, vaseline, regular tide detergent.  Food: Chicken, shrimp and beef causes some itchy skin, itchy eyes, rhinorrhea but symptoms occur many hours after ingestion. She can eat these foods in [place] with no issues.  Past work up includes: None. Dietary History: patient has been eating other foods including limited milk, eggs, limited peanut & treenuts, sesame, shellfish - okay with crab and lobster, fish, soy, wheat, meats, fruits and vegetables.  Assessment and Plan: Tricia Wood is a 25 y.o. female with: Chronic rhinitis Perennial rhinoconjunctivitis symptoms for many years with worsening in the spring.  Tried Zyrtec and azelastine items with good benefit.  No prior allergy evaluation. Today's skin prick testing showed: Negative to indoor/outdoor allergens. Get bloodwork to double check results as  I'm already ordering bloodwork for her pruritic rash.  Start zyrtec (cetirizine) 10mg  1 to 2 times per day. Use Optivar (azelastine) eye drops 0.05% twice a day as needed for itchy/watery eyes.  Pruritic rash Pruritic rash on the face and arms and chest.  No specific triggers noted but concerned about environmental and food allergies.  No recent blood work. Patient works as a Advertising account plannernail technician. Not sure what's cause your symptoms. See below for proper skin care - use fragrance free and dye free products.  Start zyrtec (cetirizine) 10mg  1 to 2 times per day. Avoid the following potential triggers: alcohol, tight clothing, NSAIDs, hot showers and getting overheated. Get bloodwork to rule out other etiologies.  If no improvement then recommend patch testing next - TRUE patch test.   Other adverse food reactions, not elsewhere classified, subsequent encounter Possibly noted worsening symptoms after eating chicken shrimp and beef however the symptoms occur overnight.  She is able to tolerate chicken and shrimp and beef in [place] with no issues. Today's skin testing showed: Negative to select foods. Monitor symptoms after eating chicken, beef and shrimp.  Based on clinical history and symptoms, I doubt that these foods are causing her pruritic rash.  For mild symptoms you can take over the counter antihistamines such as Benadryl and monitor symptoms closely. If symptoms worsen or if you have severe symptoms including breathing issues, throat closure, significant swelling, whole body hives, severe diarrhea and vomiting, lightheadedness then seek immediate medical care.  Return in about 2 months (around 07/16/2021).  Meds ordered this encounter  Medications   cetirizine (ZYRTEC ALLERGY) 10 MG tablet    Sig: Take 1 tablet (10  mg total) by mouth 2 (two) times daily.    Dispense:  60 tablet    Refill:  3    Lab Orders  Allergens w/Total IgE Area 2  ANA w/Reflex  CBC with Differential/Platelet   Comprehensive metabolic panel  C-reactive protein  Sedimentation rate  Thyroid Cascade Profile  Tryptase  Allergen Profile, Shellfish  Alpha-Gal Panel  Chicken IgE  Beef IgE    Other allergy screening: Asthma: no Medication allergy: no Hymenoptera allergy: no Urticaria: no Eczema:no History of recurrent infections suggestive of immunodeficency: no  Diagnostics: Skin Testing: Environmental allergy panel and select foods. Negative to indoor/outdoor allergens and select foods. Results discussed with patient/family.  Airborne Adult Perc - 05/15/21 1506     Time Antigen Placed 1506    Allergen Manufacturer Waynette Buttery    Location Back    Number of Test 59    Panel 1 Select    1. Control-Buffer 50% Glycerol Negative    2. Control-Histamine 1 mg/ml 2+    3. Albumin saline Negative    4. Bahia Negative    5. French Southern Territories Negative    6. Johnson Negative    7. Kentucky Blue Negative    8. Meadow Fescue Negative    9. Perennial Rye Negative    10. Sweet Vernal Negative    11. Timothy Negative    12. Cocklebur Negative    13. Burweed Marshelder Negative    14. Ragweed, short Negative    15. Ragweed, Giant Negative    16. Plantain,  English Negative    17. Lamb's Quarters Negative    18. Sheep Sorrell Negative    19. Rough Pigweed Negative    20. Marsh Elder, Rough Negative    21. Mugwort, Common Negative    22. Ash mix Negative    23. Birch mix Negative    24. Beech American Negative    25. Box, Elder Negative    26. Cedar, red Negative    27. Cottonwood, Guinea-Bissau Negative    28. Elm mix Negative    29. Hickory Negative    30. Maple mix Negative    31. Oak, Guinea-[place] mix Negative    32. Pecan Pollen Negative    33. Pine mix Negative    34. Sycamore Eastern Negative    35. Walnut, Black Pollen Negative    36. Alternaria alternata Negative    37. Cladosporium Herbarum Negative    38. Aspergillus mix Negative    39. Penicillium mix Negative    40. Bipolaris sorokiniana  (Helminthosporium) Negative    41. Drechslera spicifera (Curvularia) Negative    42. Mucor plumbeus Negative    43. Fusarium moniliforme Negative    44. Aureobasidium pullulans (pullulara) Negative    45. Rhizopus oryzae Negative    46. Botrytis cinera Negative    47. Epicoccum nigrum Negative    48. Phoma betae Negative    49. Candida Albicans Negative    50. Trichophyton mentagrophytes Negative    51. Mite, D Farinae  5,000 AU/ml Negative    52. Mite, D Pteronyssinus  5,000 AU/ml Negative    53. Cat Hair 10,000 BAU/ml Negative    54.  Dog Epithelia Negative    55. Mixed Feathers Negative    56. Horse Epithelia Negative    57. Cockroach, German Negative    58. Mouse Negative    59. Tobacco Leaf Negative             Food Adult Perc - 05/15/21 1500  Time Antigen Placed 1507    Allergen Manufacturer Greer    Location Back    Number of allergen test 17    1. Peanut Negative    2. Soybean Negative    3. Wheat Negative    4. Sesame Negative    5. Milk, cow Negative    6. Egg White, Chicken Negative    7. Casein Negative    8. Shellfish Mix Negative    9. Fish Mix Negative    10. Cashew Negative    25. Shrimp Negative    26. Crab Negative    27. Lobster Negative    28. Oyster Negative    29. Scallops Negative    39. Chicken Meat Negative    40. Beef Negative             Past Medical History: Patient Active Problem List   Diagnosis Date Noted   Chronic rhinitis 05/15/2021   Pruritic rash 05/15/2021   Other adverse food reactions, not elsewhere classified, subsequent encounter 05/15/2021   Rash and other nonspecific skin eruption 05/15/2021   Hearing loss of right ear due to cerumen impaction 11/12/2016   Past Medical History:  Diagnosis Date   Allergy    Past Surgical History: History reviewed. No pertinent surgical history. Medication List:  Current Outpatient Medications  Medication Sig Dispense Refill   Ascorbic Acid (VITAMIN C) 1000 MG tablet  Take 1,000 mg by mouth daily.     azelastine (OPTIVAR) 0.05 % ophthalmic solution Place 1 drop into both eyes 2 (two) times daily. 6 mL 2   cetirizine (ZYRTEC ALLERGY) 10 MG tablet Take 1 tablet (10 mg total) by mouth 2 (two) times daily. 60 tablet 3   hydrocortisone cream 1 % Apply to affected area 2 times daily 15 g 0   hydrOXYzine (VISTARIL) 25 MG capsule Take 1 capsule (25 mg total) by mouth 3 (three) times daily as needed. (Patient not taking: Reported on 05/15/2021) 30 capsule 1   No current facility-administered medications for this visit.   Allergies: Allergies  Allergen Reactions   Chicken Allergy Hives   Shrimp Extract Allergy Skin Test Itching   Shrimp [Shellfish Allergy] Hives   Social History: Social History   Socioeconomic History   Marital status: Single    Spouse name: Not on file   Number of children: Not on file   Years of education: Not on file   Highest education level: Not on file  Occupational History   Not on file  Tobacco Use   Smoking status: Never   Smokeless tobacco: Never  Substance and Sexual Activity   Alcohol use: No    Alcohol/week: 0.0 standard drinks   Drug use: No   Sexual activity: Not Currently  Other Topics Concern   Not on file  Social History Narrative   Not on file   Social Determinants of Health   Financial Resource Strain: Not on file  Food Insecurity: Not on file  Transportation Needs: Not on file  Physical Activity: Not on file  Stress: Not on file  Social Connections: Not on file   Lives in a 23 year old house. Smoking: denies Occupation: Financial trader HistorySurveyor, minerals in the house: no Engineer, civil (consulting) in the family room: no Carpet in the bedroom: no Heating: gas Cooling:  fan Pet: no  Family History: Family History  Problem Relation Age of Onset   Allergic rhinitis Father    Review of Systems  Constitutional:  Negative for  appetite change, chills, fever and unexpected weight change.  HENT:   Negative for congestion and rhinorrhea.   Eyes:  Positive for itching.  Respiratory:  Negative for cough, chest tightness, shortness of breath and wheezing.   Cardiovascular:  Negative for chest pain.  Gastrointestinal:  Negative for abdominal pain.  Genitourinary:  Negative for difficulty urinating.  Skin:  Positive for rash.  Neurological:  Negative for headaches.   Objective: BP 96/60   Pulse 83   Temp 98.5 F (36.9 C) (Temporal)   Resp 14   Ht 5\' 1"  (1.549 m)   Wt 92 lb 3.2 oz (41.8 kg)   SpO2 98%   BMI 17.42 kg/m  Body mass index is 17.42 kg/m. Physical Exam Vitals and nursing note reviewed.  Constitutional:      Appearance: Normal appearance. She is well-developed.  HENT:     Head: Normocephalic and atraumatic.     Right Ear: External ear normal.     Left Ear: External ear normal.     Nose: Nose normal.     Mouth/Throat:     Mouth: Mucous membranes are moist.     Pharynx: Oropharynx is clear.  Eyes:     Conjunctiva/sclera: Conjunctivae normal.  Cardiovascular:     Rate and Rhythm: Normal rate and regular rhythm.     Heart sounds: Normal heart sounds. No murmur heard.   No friction rub. No gallop.  Pulmonary:     Effort: Pulmonary effort is normal.     Breath sounds: Normal breath sounds. No wheezing, rhonchi or rales.  Abdominal:     Palpations: Abdomen is soft.  Musculoskeletal:     Cervical back: Neck supple.  Skin:    General: Skin is warm and dry.     Findings: No rash.     Comments: Very dry skin throughout.  Neurological:     Mental Status: She is alert and oriented to person, place, and time.  Psychiatric:        Behavior: Behavior normal.  The plan was reviewed with the patient/family, and all questions/concerned were addressed.  It was my pleasure to see Dandrea today and participate in her care. Please feel free to contact me with any questions or concerns.  Sincerely,  , DO Allergy & Immunology  [facility] office: [phone] [facility] office: [phone]

## 2021-05-19 LAB — COMPREHENSIVE METABOLIC PANEL
ALT: 7 IU/L (ref 0–32)
AST: 17 IU/L (ref 0–40)
Albumin/Globulin Ratio: 1.3 (ref 1.2–2.2)
Albumin: 4.9 g/dL (ref 3.9–5.0)
Alkaline Phosphatase: 53 IU/L (ref 44–121)
BUN/Creatinine Ratio: 15 (ref 9–23)
BUN: 9 mg/dL (ref 6–20)
Bilirubin Total: 0.4 mg/dL (ref 0.0–1.2)
CO2: 23 mmol/L (ref 20–29)
Calcium: 9.8 mg/dL (ref 8.7–10.2)
Chloride: 99 mmol/L (ref 96–106)
Creatinine, Ser: 0.61 mg/dL (ref 0.57–1.00)
Globulin, Total: 3.8 g/dL (ref 1.5–4.5)
Glucose: 91 mg/dL (ref 65–99)
Potassium: 4.1 mmol/L (ref 3.5–5.2)
Sodium: 141 mmol/L (ref 134–144)
Total Protein: 8.7 g/dL — ABNORMAL HIGH (ref 6.0–8.5)
eGFR: 129 mL/min/{1.73_m2} (ref >59)

## 2021-05-19 LAB — ALPHA-GAL PANEL
Allergen Lamb IgE: <0.1 kU/L
Beef IgE: <0.1 kU/L
IgE (Immunoglobulin E), Serum: 71 IU/mL (ref 6–495)
O215-IgE Alpha-Gal: <0.1 kU/L
Pork IgE: <0.1 kU/L

## 2021-05-19 LAB — ALLERGENS W/TOTAL IGE AREA 2

## 2021-05-19 LAB — CBC WITH DIFFERENTIAL/PLATELET
Basophils Absolute: 0 10*3/uL (ref 0.0–0.2)
Basos: 1 %
EOS (ABSOLUTE): 0.3 10*3/uL (ref 0.0–0.4)
Eos: 4 %
Hematocrit: 40.8 % (ref 34.0–46.6)
Hemoglobin: 13.7 g/dL (ref 11.1–15.9)
Immature Grans (Abs): 0 10*3/uL (ref 0.0–0.1)
Immature Granulocytes: 0 %
Lymphocytes Absolute: 2.3 10*3/uL (ref 0.7–3.1)
Lymphs: 31 %
MCH: 30.9 pg (ref 26.6–33.0)
MCHC: 33.6 g/dL (ref 31.5–35.7)
MCV: 92 fL (ref 79–97)
Monocytes Absolute: 0.5 10*3/uL (ref 0.1–0.9)
Monocytes: 7 %
Neutrophils Absolute: 4.2 10*3/uL (ref 1.4–7.0)
Neutrophils: 57 %
Platelets: 261 10*3/uL (ref 150–450)
RBC: 4.43 x10E6/uL (ref 3.77–5.28)
RDW: 12.3 % (ref 11.7–15.4)
WBC: 7.4 10*3/uL (ref 3.4–10.8)

## 2021-05-19 LAB — ALLERGEN PROFILE, SHELLFISH
Clam IgE: <0.1 kU/L
F023-IgE Crab: <0.1 kU/L
F080-IgE Lobster: <0.1 kU/L
F290-IgE Oyster: <0.1 kU/L
Scallop IgE: <0.1 kU/L
Shrimp IgE: <0.1 kU/L

## 2021-05-19 LAB — SEDIMENTATION RATE: Sed Rate: 13 mm/hr (ref 0–32)

## 2021-05-19 LAB — C-REACTIVE PROTEIN: CRP: <1 mg/L (ref 0–10)

## 2021-05-19 LAB — ANA W/REFLEX: Anti Nuclear Antibody (ANA): NEGATIVE

## 2021-05-19 LAB — ALLERGEN, CHICKEN F83: Chicken IgE: <0.1 kU/L

## 2021-05-19 LAB — THYROID CASCADE PROFILE: TSH: 1.96 u[IU]/mL (ref 0.450–4.500)

## 2021-05-19 LAB — TRYPTASE: Tryptase: 4.1 ug/L (ref 2.2–13.2)

## 2021-07-31 ENCOUNTER — Ambulatory Visit: Payer: 59 | Admitting: Allergy

## 2022-01-25 IMAGING — DX DG ANKLE COMPLETE 3+V*L*
3 series · 3 of 3 positions shown · non-contrast
Comparison: None.

CLINICAL DATA: Left Achilles pain.

EXAM:
LEFT ANKLE COMPLETE - 3+ VIEW

[ankle ap]
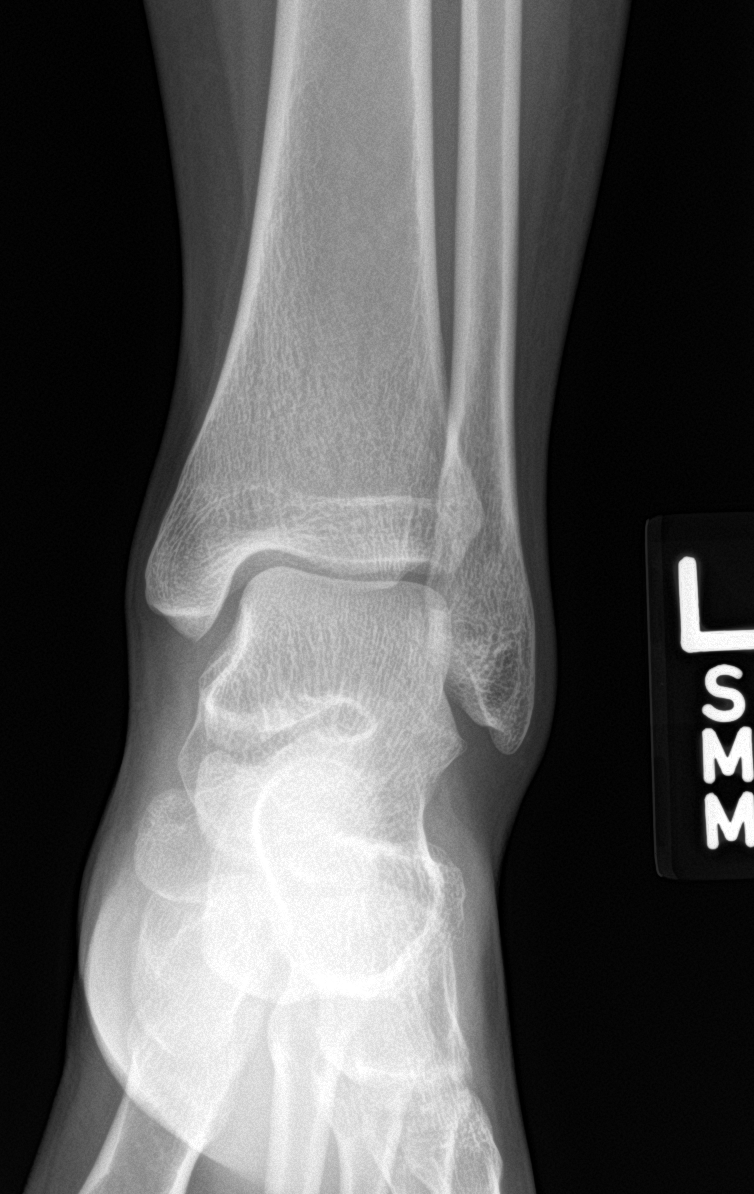

[ankle obl]
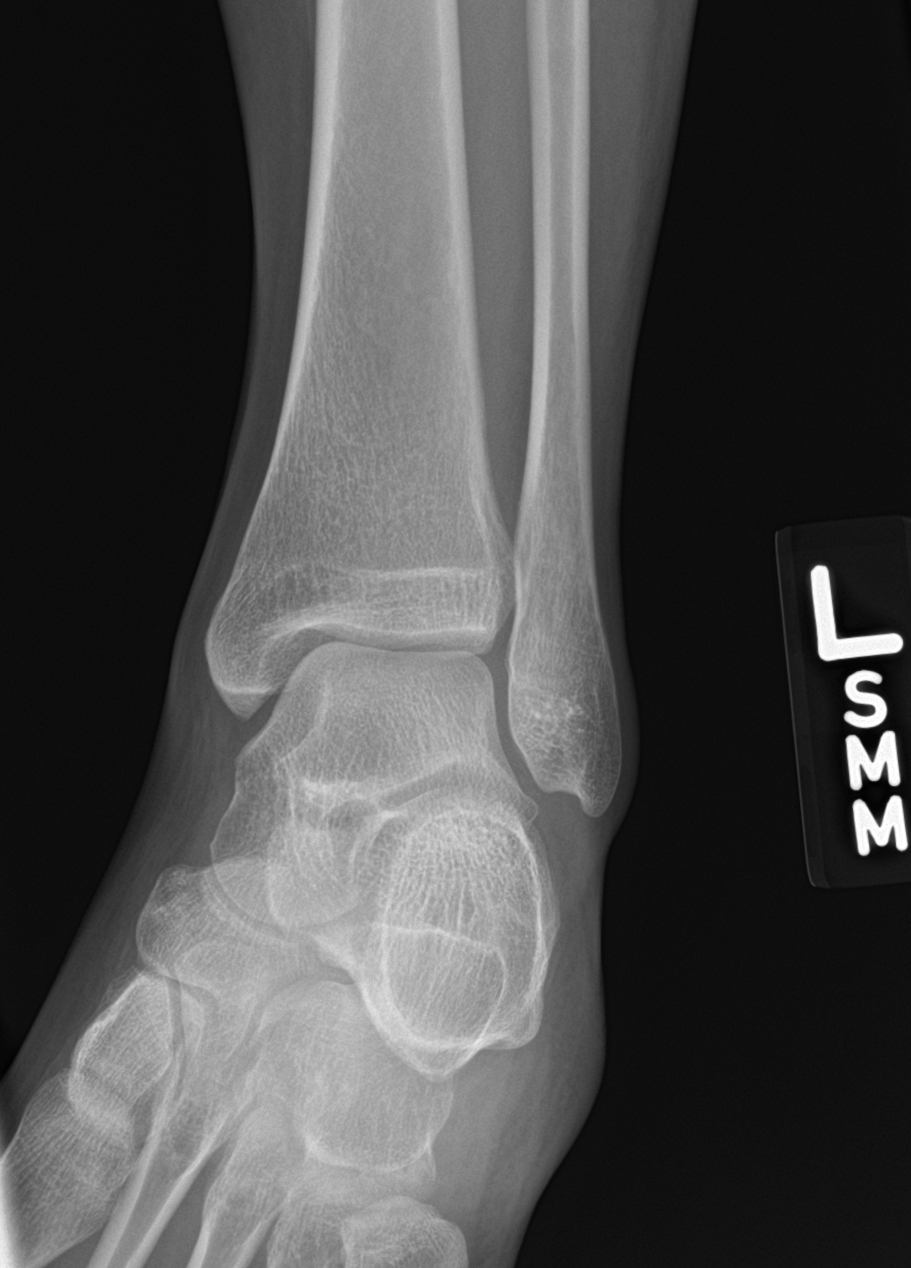

[ankle lat]
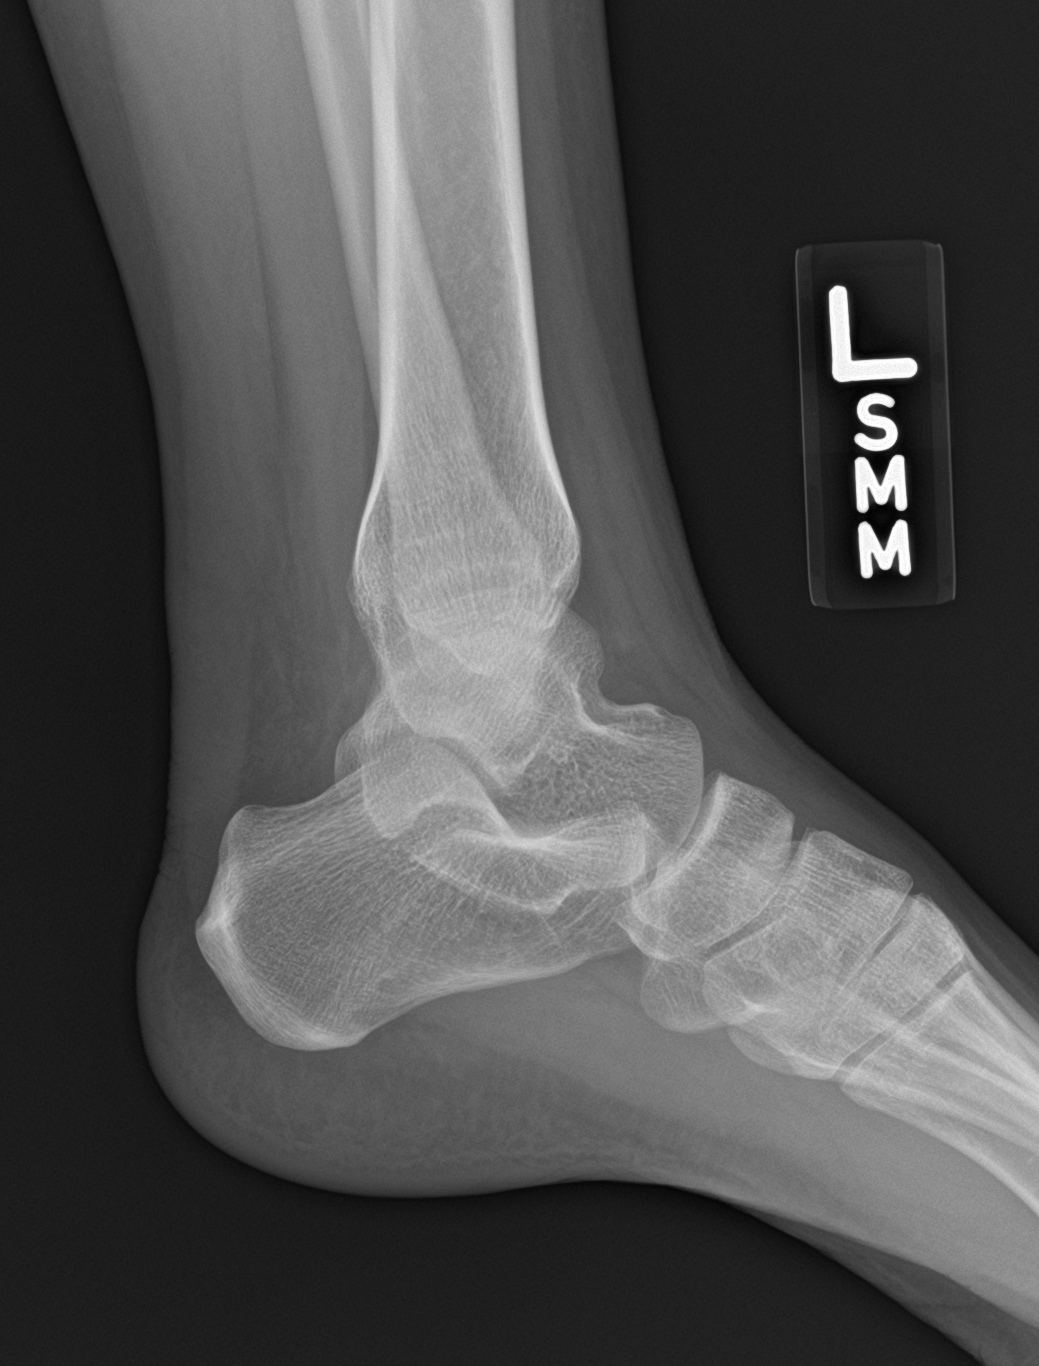

[3 of 3 positions shown; findings below may reference images not displayed]

FINDINGS: There is no evidence of fracture, dislocation, or joint effusion.
There is no evidence of arthropathy or other focal bone abnormality.
Soft tissues are unremarkable.
IMPRESSION: No acute osseous injury of the left ankle.

## 2022-07-31 ENCOUNTER — Encounter (HOSPITAL_COMMUNITY): Payer: Self-pay | Admitting: *Deleted

## 2022-07-31 ENCOUNTER — Ambulatory Visit (HOSPITAL_COMMUNITY)
Admission: EM | Admit: 2022-07-31 | Discharge: 2022-07-31 | Disposition: A | Discharge status: Home / Self Care | Payer: 59 | Attending: Physician Assistant | Admitting: Physician Assistant

## 2022-07-31 ENCOUNTER — Other Ambulatory Visit: Payer: Self-pay

## 2022-07-31 ENCOUNTER — Telehealth (HOSPITAL_COMMUNITY): Payer: Self-pay

## 2022-07-31 DIAGNOSIS — K6289 Other specified diseases of anus and rectum: Secondary | ICD-10-CM | POA: Diagnosis not present

## 2022-07-31 DIAGNOSIS — K644 Residual hemorrhoidal skin tags: Secondary | ICD-10-CM

## 2022-07-31 MED ORDER — HYDROCORT-PRAMOXINE (PERIANAL) 1-1 % EX FOAM: 1.0000 | Freq: Four times a day (QID) | CUTANEOUS | 1 refills | Status: DC

## 2022-07-31 MED ORDER — METAMUCIL 0.52 G PO CAPS: 0.5200 g | ORAL_CAPSULE | Freq: Two times a day (BID) | ORAL | 0 refills | Status: DC

## 2022-07-31 NOTE — ED Triage Notes (Signed)
Pt reports pain from hemorrhoids for almost a month. Pt has noticed some bleeding from hemorrhoids when wiping.

## 2022-07-31 NOTE — ED Provider Notes (Signed)
[facility]    CSN: [id number] Arrival date & time: 07/31/22  1454      History   Chief Complaint Chief Complaint  Patient presents with   Hemorrhoids    HPI Tricia Wood is a 26 y.o. female.   26 year old female presents with hemorrhoids.  Patient indicates over the past 1 to 2 weeks that she has been having some increased constipation, hard stools, she has been having to strain in order to have a bowel movement.  Patient relates over the past several days she has noticed that she is having a reoccurrence of her hemorrhoids.  These are painful, swollen, and she has also noticed some blood when she wipes.  She relates that she does not know exactly what to do in order to help these to resolve.  She denies any fever, chills, nausea or vomiting.     Past Medical History:  Diagnosis Date   Allergy     Patient Active Problem List   Diagnosis Date Noted   Chronic rhinitis 05/15/2021   Pruritic rash 05/15/2021   Other adverse food reactions, not elsewhere classified, subsequent encounter 05/15/2021   Rash and other nonspecific skin eruption 05/15/2021   Hearing loss of right ear due to cerumen impaction 11/12/2016    History reviewed. No pertinent surgical history.  OB History   No obstetric history on file.      Home Medications    Prior to Admission medications   Medication Sig Start Date End Date Taking? Authorizing Provider  hydrocortisone-pramoxine [facility]) rectal foam Place 1 applicator rectally 4 (four) times daily. 07/31/22  Yes Ellsworth Lennox, PA-C  psyllium (METAMUCIL) 0.52 g capsule Take 1 capsule (0.52 g total) by mouth 2 (two) times daily. Drink 6 oz of water with each capsule. 07/31/22  Yes Ellsworth Lennox, PA-C  Ascorbic Acid (VITAMIN C) 1000 MG tablet Take 1,000 mg by mouth daily.    [provider]  azelastine (OPTIVAR) 0.05 % ophthalmic solution Place 1 drop into both eyes 2 (two) times daily. 03/28/21   Particia Nearing, PA-C   cetirizine (ZYRTEC ALLERGY) 10 MG tablet Take 1 tablet (10 mg total) by mouth 2 (two) times daily. 05/15/21   Ellamae Sia, DO  hydrocortisone cream 1 % Apply to affected area 2 times daily 03/28/21   Particia Nearing, PA-C  hydrOXYzine (VISTARIL) 25 MG capsule Take 1 capsule (25 mg total) by mouth 3 (three) times daily as needed. Patient not taking: Reported on 05/15/2021 12/31/17   Wallis Bamberg, PA-C    Family History Family History  Problem Relation Age of Onset   Allergic rhinitis Father     Social History Social History   Tobacco Use   Smoking status: Never   Smokeless tobacco: Never  Substance Use Topics   Alcohol use: No    Alcohol/week: 0.0 standard drinks of alcohol   Drug use: No     Allergies   Chicken allergy, Shrimp extract allergy skin test, and Shrimp [shellfish allergy]   Review of Systems Review of Systems  Gastrointestinal:  Positive for blood in stool (hemorroids).     Physical Exam Triage Vital Signs ED Triage Vitals  Enc Vitals Group     BP 07/31/22 1517 104/72     Pulse Rate 07/31/22 1517 99     Resp 07/31/22 1517 18     Temp 07/31/22 1517 99.2 F (37.3 C)     Temp src --      SpO2 07/31/22 1517 98 %  Weight --      Height --      Head Circumference --      Peak Flow --      Pain Score 07/31/22 1514 6     Pain Loc --      Pain Edu? --      Excl. in GC? --    No data found.  Updated Vital Signs BP 104/72   Pulse 99   Temp 99.2 F (37.3 C)   Resp 18   LMP 07/06/2022 (Approximate)   SpO2 98%   Visual Acuity Right Eye Distance:   Left Eye Distance:   Bilateral Distance:    Right Eye Near:   Left Eye Near:    Bilateral Near:     Physical Exam Constitutional:      Appearance: Normal appearance.  Abdominal:     General: Abdomen is flat. Bowel sounds are normal.     Palpations: Abdomen is soft.     Tenderness: There is no abdominal tenderness.  Genitourinary:    Comments: Rectal: There are 2 small external  hemorrhoids that are present which are nonthrombosed, mild redness associated.  No surrounding redness or irritation at the rectal orifice. Neurological:     Mental Status: She is alert.      UC Treatments / Results  Labs (all labs ordered are listed, but only abnormal results are displayed) Labs Reviewed - No data to display  EKG   Radiology No results found.  Procedures Procedures (including critical care time)  Medications Ordered in UC Medications - No data to display  Initial Impression / Assessment and Plan / UC Course  I have reviewed the triage vital signs and the nursing notes.  Pertinent labs & imaging results that were available during my care of the patient were reviewed by me and considered in my medical decision making (see chart for details).       Plan: 1.  The hemorrhoids will be treated with Metamucil capsules, 1 every 12 hours followed with 6 ounces of water, this will decrease constipation, soften the stools and allow for a less stressful bowel movement. 2.  The hemorrhoids will be treated with Proctofoam HC, apply to the area 3-4 times a day and inject some up into the rectum each time to help reduce and shrink the hemorrhoids. 3.  Patient advised to use warm Epsom salt soaks several times throughout the day to help strengthen hemorrhoids. 4.  Patient advised follow-up PCP or return to urgent care if symptoms fail to improve. Final Clinical Impressions(s) / UC Diagnoses   Final diagnoses:  Rectal pain  Hemorrhoids, external without complications     Discharge Instructions      Advised to take Tylenol or ibuprofen for pain relief. Advised to take the Metamucil capsules, 1 every 12 hours followed with 6 ounces of water after each dose.  This type of medication will help the bowel movements to be soft and will decrease the difficulty in having to strain. Advised to use the Proctofoam HC, apply to the outside hemorrhoids and then inject some up into  the rectum, this needs to be performed 3-4 times throughout the day. Advised warm Epsom salt soaks as this will help to shrink the hemorrhoids. Advised to follow-up with PCP or return to urgent care if symptoms fail to improve.     ED Prescriptions     Medication Sig Dispense Auth. Provider   hydrocortisone-pramoxine [facility]) rectal foam Place 1 applicator rectally 4 (four) times  daily. 10 g Nyoka Lint, PA-C   psyllium (METAMUCIL) 0.52 g capsule Take 1 capsule (0.52 g total) by mouth 2 (two) times daily. Drink 6 oz of water with each capsule. 30 capsule Nyoka Lint, PA-C      PDMP not reviewed this encounter.   Nyoka Lint, PA-C 07/31/22 1541

## 2022-07-31 NOTE — Discharge Instructions (Addendum)
Advised to take Tylenol or ibuprofen for pain relief. Advised to take the Metamucil capsules, 1 every 12 hours followed with 6 ounces of water after each dose.  This type of medication will help the bowel movements to be soft and will decrease the difficulty in having to strain. Advised to use the Proctofoam HC, apply to the outside hemorrhoids and then inject some up into the rectum, this needs to be performed 3-4 times throughout the day. Advised warm Epsom salt soaks as this will help to shrink the hemorrhoids. Advised to follow-up with PCP or return to urgent care if symptoms fail to improve.

## 2022-07-31 NOTE — Telephone Encounter (Signed)
Patient calling in and informed the front desk that one of the medications sent is not covered by insurance.  Called to inquire which medication Patient is having trouble filling. No answer, no voicemail.

## 2022-08-01 ENCOUNTER — Telehealth (HOSPITAL_COMMUNITY): Payer: Self-pay | Admitting: Emergency Medicine

## 2022-08-01 MED ORDER — METAMUCIL 0.52 G PO CAPS: 0.5200 g | ORAL_CAPSULE | Freq: Two times a day (BID) | ORAL | 0 refills | Status: DC

## 2022-08-01 MED ORDER — HYDROCORT-PRAMOXINE (PERIANAL) 1-1 % EX FOAM: 1.0000 | Freq: Four times a day (QID) | CUTANEOUS | 1 refills | Status: AC

## 2022-08-01 NOTE — Telephone Encounter (Signed)
Patient needed prescriptions sent to a different pharmacy

## 2022-11-05 NOTE — L&D Delivery Note (Signed)
Delivery Note At 1:25 PM a viable and healthy female was delivered via Vaginal, Spontaneous (Presentation: Left Occiput Anterior).  APGAR: 8, 9; weight pending .   Placenta status: Spontaneous;Expressed, Intact.  Cord: 3 vessels   The patient pushed for approximately an hour and a half and delivered a vigorous female infant in the left occiput anterior presentation with Apgar scores of 8 at 1 minute and 9 at 5 minutes.  Thick meconium stained fluid was noted at the time of delivery.  This was not apparent prior to delivery.  Following delivery the infant was passed to the maternal abdomen and bulb suctioned.  Following a 1 minute delay, the cord was clamped and cut.  The placenta delivered spontaneously, intact, with three-vessel cord.  Atony was noted immediately after placental delivery.  This responded well to bimanual massage.  A second-degree perineal laceration was repaired with 2-0 and 3-0 Vicryl Rapide.  All sponge, needle, instrument counts were correct.  Estimated blood loss 573 cc.  The patient spiked temperature approximately 2 hours prior to delivery and received amp and gent.  The placenta will be sent to pathology for evaluation due to temperature spike.  Anesthesia: Epidural Episiotomy:  none Lacerations: 2nd degree;Perineal Suture Repair: 2.0 3.0 vicryl rapide Est. Blood Loss (mL):  573 cc  Mom to postpartum.  Baby to Couplet care / Skin to Skin.  Waynard Reeds 08/19/2023, 1:56 PM

## 2023-01-07 LAB — OB RESULTS CONSOLE RPR: RPR: NONREACTIVE

## 2023-01-07 LAB — OB RESULTS CONSOLE GC/CHLAMYDIA
Chlamydia: NEGATIVE
Neisseria Gonorrhea: NEGATIVE

## 2023-01-07 LAB — OB RESULTS CONSOLE RUBELLA ANTIBODY, IGM: Rubella: IMMUNE

## 2023-01-07 LAB — OB RESULTS CONSOLE VARICELLA ZOSTER ANTIBODY, IGG: Varicella: IMMUNE

## 2023-01-07 LAB — OB RESULTS CONSOLE HEPATITIS B SURFACE ANTIGEN: Hepatitis B Surface Ag: NEGATIVE

## 2023-02-26 LAB — OB RESULTS CONSOLE HIV ANTIBODY (ROUTINE TESTING): HIV: NONREACTIVE

## 2023-03-13 ENCOUNTER — Ambulatory Visit (HOSPITAL_COMMUNITY)
Admission: EM | Admit: 2023-03-13 | Discharge: 2023-03-13 | Disposition: A | Discharge status: Home / Self Care | Payer: Medicaid Other | Attending: Internal Medicine | Admitting: Internal Medicine

## 2023-03-13 ENCOUNTER — Encounter (HOSPITAL_COMMUNITY): Payer: Self-pay | Admitting: *Deleted

## 2023-03-13 ENCOUNTER — Other Ambulatory Visit: Payer: Self-pay

## 2023-03-13 DIAGNOSIS — R0789 Other chest pain: Secondary | ICD-10-CM

## 2023-03-13 DIAGNOSIS — K219 Gastro-esophageal reflux disease without esophagitis: Secondary | ICD-10-CM | POA: Diagnosis not present

## 2023-03-13 MED ORDER — SIMETHICONE 80 MG PO CHEW: 80.0000 mg | CHEWABLE_TABLET | Freq: Four times a day (QID) | ORAL | 0 refills | Status: DC | PRN

## 2023-03-13 MED ORDER — CALCIUM CARBONATE ANTACID 500 MG PO CHEW: 1.0000 | CHEWABLE_TABLET | Freq: Three times a day (TID) | ORAL | 0 refills | Status: DC | PRN

## 2023-03-13 NOTE — ED Provider Notes (Signed)
[facility]    CSN: [id number] Arrival date & time: 03/13/23  1019      History   Chief Complaint Chief Complaint  Patient presents with   Shortness of Breath   Chest Pain    HPI Tricia Wood is a 27 y.o. female.   Patient presents to clinic for central chest tightness and shortness of breath that started after eating lunch today.  She did have [place] fries, some rice and a barbecue sandwich for lunch.  Reports she has had an intermittent dry cough.  She denies history of acid reflux.  She is [redacted] weeks pregnant, this is her first pregnancy.  She denies any vaginal bleeding or cramping.    The history is provided by the patient and medical records.  Shortness of Breath Associated symptoms: chest pain   Associated symptoms: no abdominal pain, no fever, no sore throat and no wheezing   Chest Pain Associated symptoms: shortness of breath   Associated symptoms: no abdominal pain, no fatigue and no fever     Past Medical History:  Diagnosis Date   Allergy     Patient Active Problem List   Diagnosis Date Noted   Chronic rhinitis 05/15/2021   Pruritic rash 05/15/2021   Other adverse food reactions, not elsewhere classified, subsequent encounter 05/15/2021   Rash and other nonspecific skin eruption 05/15/2021   Hearing loss of right ear due to cerumen impaction 11/12/2016    No past surgical history on file.  OB History     Gravida  1   Para      Term      Preterm      AB      Living         SAB      IAB      Ectopic      Multiple      Live Births               Home Medications    Prior to Admission medications   Medication Sig Start Date End Date Taking? Authorizing Provider  calcium carbonate (TUMS) 500 MG chewable tablet Chew 1 tablet (200 mg of elemental calcium total) by mouth 3 (three) times daily with meals as needed for indigestion or heartburn. 03/13/23  Yes Rinaldo Ratel, [place], FNP  simethicone (GAS-X) 80 MG chewable tablet Chew  1 tablet (80 mg total) by mouth every 6 (six) hours as needed for flatulence. 03/13/23  Yes Rinaldo Ratel, [place], FNP  Ascorbic Acid (VITAMIN C) 1000 MG tablet Take 1,000 mg by mouth daily.    [provider]  azelastine (OPTIVAR) 0.05 % ophthalmic solution Place 1 drop into both eyes 2 (two) times daily. 03/28/21   Particia Nearing, PA-C  cetirizine (ZYRTEC ALLERGY) 10 MG tablet Take 1 tablet (10 mg total) by mouth 2 (two) times daily. 05/15/21   Ellamae Sia, DO  hydrocortisone cream 1 % Apply to affected area 2 times daily 03/28/21   Particia Nearing, PA-C  hydrocortisone-pramoxine [facility]) rectal foam Place 1 applicator rectally 4 (four) times daily. 08/01/22   Lamptey, Britta Mccreedy, MD  hydrOXYzine (VISTARIL) 25 MG capsule Take 1 capsule (25 mg total) by mouth 3 (three) times daily as needed. Patient not taking: Reported on 05/15/2021 12/31/17   Wallis Bamberg, PA-C  psyllium (METAMUCIL) 0.52 g capsule Take 1 capsule (0.52 g total) by mouth 2 (two) times daily. Drink 6 oz of water with each capsule. 08/01/22   Lamptey,  Britta Mccreedy, MD    Family History Family History  Problem Relation Age of Onset   Allergic rhinitis Father     Social History Social History   Tobacco Use   Smoking status: Never   Smokeless tobacco: Never  Substance Use Topics   Alcohol use: No    Alcohol/week: 0.0 standard drinks of alcohol   Drug use: No     Allergies   Chicken allergy, Shrimp extract, and Shrimp [shellfish allergy]   Review of Systems Review of Systems  Constitutional:  Negative for fatigue and fever.  HENT:  Negative for sore throat.   Respiratory:  Positive for chest tightness and shortness of breath. Negative for wheezing.   Cardiovascular:  Positive for chest pain. Negative for leg swelling.  Gastrointestinal:  Negative for abdominal pain.     Physical Exam Triage Vital Signs ED Triage Vitals  Enc Vitals Group     BP 03/13/23 1124 107/69     Pulse Rate 03/13/23 1124  (!) 102     Resp 03/13/23 1124 18     Temp 03/13/23 1124 98 F (36.7 C)     Temp src --      SpO2 03/13/23 1124 98 %     Weight --      Height --      Head Circumference --      Peak Flow --      Pain Score 03/13/23 1118 4     Pain Loc --      Pain Edu? --      Excl. in GC? --    No data found.  Updated Vital Signs BP 107/69   Pulse (!) 102   Temp 98 F (36.7 C)   Resp 18   LMP  (Approximate)   SpO2 98%   Visual Acuity Right Eye Distance:   Left Eye Distance:   Bilateral Distance:    Right Eye Near:   Left Eye Near:    Bilateral Near:     Physical Exam Vitals and nursing note reviewed.  Constitutional:      General: She is not in acute distress.    Appearance: She is well-developed.  HENT:     Head: Normocephalic and atraumatic.     Mouth/Throat:     Mouth: Mucous membranes are moist.  Eyes:     Conjunctiva/sclera: Conjunctivae normal.  Cardiovascular:     Rate and Rhythm: Normal rate and regular rhythm.     Heart sounds: Normal heart sounds. No murmur heard. Pulmonary:     Effort: Pulmonary effort is normal. No respiratory distress.     Breath sounds: Normal breath sounds. No decreased breath sounds.  Abdominal:     Palpations: Abdomen is soft.     Tenderness: There is no abdominal tenderness.  Musculoskeletal:        General: No swelling. Normal range of motion.     Cervical back: Normal range of motion and neck supple.     Right lower leg: No edema.     Left lower leg: No edema.  Skin:    General: Skin is warm and dry.     Capillary Refill: Capillary refill takes less than 2 seconds.  Neurological:     General: No focal deficit present.     Mental Status: She is alert and oriented to person, place, and time.  Psychiatric:        Mood and Affect: Mood normal.        Behavior: Behavior normal.  UC Treatments / Results  Labs (all labs ordered are listed, but only abnormal results are displayed) Labs Reviewed - No data to  display  EKG   Radiology No results found.  Procedures Procedures (including critical care time)  Medications Ordered in UC Medications - No data to display  Initial Impression / Assessment and Plan / UC Course  I have reviewed the triage vital signs and the nursing notes.  Pertinent labs & imaging results that were available during my care of the patient were reviewed by me and considered in my medical decision making (see chart for details).  Vitals and triage reviewed, patient is hemodynamically stable.  Lungs vesicular posteriorly, without abdominal pain or tenderness, active bowel sounds.  EKG in clinic shows ventricular rate of 88 bpm, normal sinus rhythm and a normal EKG without ST elevation or ST depression.  Low concern for cardiac etiology for chest tightness.  Suspect acid reflux due to recent meal of [place] fries and a barbecue sandwich.  Advised Tums and simethicone as needed, small frequent meals.  Given a medication list for medications that are safe during pregnancy.  Given information for MAU if needed.  Advised to follow-up with OB/GYN.  Return and follow-up precautions reviewed, no questions at this time.     Final Clinical Impressions(s) / UC Diagnoses   Final diagnoses:  Atypical chest pain  Gastroesophageal reflux disease without esophagitis     Discharge Instructions      Overall your physical exam was reassuring and your EKG did not show any signs or symptoms of a heart attack.  I have attached some medications that are safe to use during pregnancy.  I believe that you are suffering from acid reflux and heartburn.  You can use Tums as needed with meals in simethicone every 6 hours.  Please avoid meals that are high in fat or highly processed.  You can do small frequent meals to help prevent indigestion.  Please follow-up with your OB within the next week for reassessment.  If you develop any abdominal pain, cramping, or vaginal bleeding, please head  immediately to the maternity assessment unit over at [facility], their information is attached.      ED Prescriptions     Medication Sig Dispense Auth. Provider   simethicone (GAS-X) 80 MG chewable tablet Chew 1 tablet (80 mg total) by mouth every 6 (six) hours as needed for flatulence. 30 tablet Rinaldo Ratel, [place], [place]   calcium carbonate (TUMS) 500 MG chewable tablet Chew 1 tablet (200 mg of elemental calcium total) by mouth 3 (three) times daily with meals as needed for indigestion or heartburn. 90 tablet Howie Rufus, [place], [place]      PDMP not reviewed this encounter.   Devyon Keator, [place] N, [place] 03/13/23 1152

## 2023-03-13 NOTE — ED Triage Notes (Signed)
PT reports her due date is October 11,2024.

## 2023-03-13 NOTE — Discharge Instructions (Signed)
Overall your physical exam was reassuring and your EKG did not show any signs or symptoms of a heart attack.  I have attached some medications that are safe to use during pregnancy.  I believe that you are suffering from acid reflux and heartburn.  You can use Tums as needed with meals in simethicone every 6 hours.  Please avoid meals that are high in fat or highly processed.  You can do small frequent meals to help prevent indigestion.  Please follow-up with your OB within the next week for reassessment.  If you develop any abdominal pain, cramping, or vaginal bleeding, please head immediately to the maternity assessment unit over at [facility], their information is attached.

## 2023-03-13 NOTE — ED Triage Notes (Signed)
Pt reports since yesterday she has had SHOB and chest tightness.

## 2023-07-22 ENCOUNTER — Ambulatory Visit
Admission: RE | Admit: 2023-07-22 | Discharge: 2023-07-22 | Disposition: A | Discharge status: Home / Self Care | Payer: Medicaid Other | Source: Ambulatory Visit | Attending: Internal Medicine | Admitting: Internal Medicine

## 2023-07-22 VITALS — BP 97/63 | HR 119 | Temp 98.8°F | Resp 18

## 2023-07-22 DIAGNOSIS — L309 Dermatitis, unspecified: Secondary | ICD-10-CM | POA: Diagnosis not present

## 2023-07-22 MED ORDER — TRIAMCINOLONE ACETONIDE 0.025 % EX OINT: 1.0000 | TOPICAL_OINTMENT | Freq: Two times a day (BID) | CUTANEOUS | 0 refills | Status: DC

## 2023-07-22 NOTE — Discharge Instructions (Signed)
Start topical triamcinolone steroid cream to the rash twice daily for 5 days.  Apply sparingly.  Do not use more than 5 days.  Do not apply to face, abdomen, chest.  Please follow-up with your OB or PCP in 2 days for recheck.  Please go to the ER for any worsening symptoms.  I Hope you feel better soon!

## 2023-07-22 NOTE — ED Provider Notes (Signed)
[facility]    CSN: [id number] Arrival date & time: 07/22/23  1031      History   Chief Complaint Chief Complaint  Patient presents with   Rash    Some spot hives on the arm - Entered by patient    HPI Tricia Wood is a 27 y.o. female presents for evaluation of a rash. Pt is [redacted] weeks pregnant. Pt reports yesterday she developed a pruritic rash on her right arm. Denies pain, drainage, fevers, chills. No new contacts including soaps, medications, lotions, etc. She used an OTC hydrocortisone cream with some improvement. No other concerns at this time.    Rash   Past Medical History:  Diagnosis Date   Allergy     Patient Active Problem List   Diagnosis Date Noted   Chronic rhinitis 05/15/2021   Pruritic rash 05/15/2021   Other adverse food reactions, not elsewhere classified, subsequent encounter 05/15/2021   Rash and other nonspecific skin eruption 05/15/2021   Hearing loss of right ear due to cerumen impaction 11/12/2016    History reviewed. No pertinent surgical history.  OB History     Gravida  1   Para      Term      Preterm      AB      Living         SAB      IAB      Ectopic      Multiple      Live Births               Home Medications    Prior to Admission medications   Medication Sig Start Date End Date Taking? Authorizing Provider  triamcinolone (KENALOG) 0.025 % ointment Apply 1 Application topically 2 (two) times daily. 07/22/23  Yes Radford Pax, NP  Ascorbic Acid (VITAMIN C) 1000 MG tablet Take 1,000 mg by mouth daily.    [provider]  azelastine (OPTIVAR) 0.05 % ophthalmic solution Place 1 drop into both eyes 2 (two) times daily. 03/28/21   Particia Nearing, PA-C  calcium carbonate (TUMS) 500 MG chewable tablet Chew 1 tablet (200 mg of elemental calcium total) by mouth 3 (three) times daily with meals as needed for indigestion or heartburn. 03/13/23   Garrison, [place] N, FNP  cetirizine (ZYRTEC ALLERGY)  10 MG tablet Take 1 tablet (10 mg total) by mouth 2 (two) times daily. 05/15/21   Ellamae Sia, DO  hydrocortisone cream 1 % Apply to affected area 2 times daily 03/28/21   Particia Nearing, PA-C  hydrocortisone-pramoxine [facility]) rectal foam Place 1 applicator rectally 4 (four) times daily. 08/01/22   Lamptey, Britta Mccreedy, MD  hydrOXYzine (VISTARIL) 25 MG capsule Take 1 capsule (25 mg total) by mouth 3 (three) times daily as needed. Patient not taking: Reported on 05/15/2021 12/31/17   Wallis Bamberg, PA-C  psyllium (METAMUCIL) 0.52 g capsule Take 1 capsule (0.52 g total) by mouth 2 (two) times daily. Drink 6 oz of water with each capsule. 08/01/22   Lamptey, Britta Mccreedy, MD  simethicone (GAS-X) 80 MG chewable tablet Chew 1 tablet (80 mg total) by mouth every 6 (six) hours as needed for flatulence. 03/13/23   Garrison, [place] N, FNP    Family History Family History  Problem Relation Age of Onset   Allergic rhinitis Father     Social History Social History   Tobacco Use   Smoking status: Never   Smokeless tobacco: Never  Substance  Use Topics   Alcohol use: No    Alcohol/week: 0.0 standard drinks of alcohol   Drug use: No     Allergies   Chicken allergy, Shrimp extract, and Shrimp [shellfish allergy]   Review of Systems Review of Systems  Skin:  Positive for rash.     Physical Exam Triage Vital Signs ED Triage Vitals  Encounter Vitals Group     BP 07/22/23 1049 97/63     Systolic BP Percentile --      Diastolic BP Percentile --      Pulse Rate 07/22/23 1049 (!) 119     Resp 07/22/23 1049 18     Temp 07/22/23 1049 98.8 F (37.1 C)     Temp Source 07/22/23 1049 Oral     SpO2 07/22/23 1049 96 %     Weight --      Height --      Head Circumference --      Peak Flow --      Pain Score 07/22/23 1048 0     Pain Loc --      Pain Education --      Exclude from Growth Chart --    No data found.  Updated Vital Signs BP 97/63 (BP Location: Right Arm)   Pulse (!) 119    Temp 98.8 F (37.1 C) (Oral)   Resp 18   LMP 07/06/2022 (Approximate)   SpO2 96%   Visual Acuity Right Eye Distance:   Left Eye Distance:   Bilateral Distance:    Right Eye Near:   Left Eye Near:    Bilateral Near:     Physical Exam Vitals and nursing note reviewed.  Constitutional:      General: She is not in acute distress.    Appearance: Normal appearance. She is not ill-appearing.  HENT:     Head: Normocephalic and atraumatic.  Eyes:     Pupils: Pupils are equal, round, and reactive to light.  Cardiovascular:     Rate and Rhythm: Normal rate.  Pulmonary:     Effort: Pulmonary effort is normal.  Skin:    General: Skin is warm and dry.     Findings: Rash present. Rash is papular. Rash is not crusting, nodular, purpuric, pustular, scaling, urticarial or vesicular.          Comments: Mildly erythematous papular rash on the right mid arm. No swelling, drainage, warmth.   Neurological:     General: No focal deficit present.     Mental Status: She is alert and oriented to person, place, and time.  Psychiatric:        Mood and Affect: Mood normal.        Behavior: Behavior normal.      UC Treatments / Results  Labs (all labs ordered are listed, but only abnormal results are displayed) Labs Reviewed - No data to display  EKG   Radiology No results found.  Procedures Procedures (including critical care time)  Medications Ordered in UC Medications - No data to display  Initial Impression / Assessment and Plan / UC Course  I have reviewed the triage vital signs and the nursing notes.  Pertinent labs & imaging results that were available during my care of the patient were reviewed by me and considered in my medical decision making (see chart for details).     Reviewed exam and symptoms with patient.  Will start triamcinolone potency topical steroid to the affected area.  Patient instructed to use  more than 5 days and to apply sparingly.  Instructed not to  apply to chest or abdomen.  Follow-up with PCP or OB in 2 days for recheck.  Strict ER precautions reviewed and patient verbalized understanding. Final Clinical Impressions(s) / UC Diagnoses   Final diagnoses:  Dermatitis     Discharge Instructions      Start topical triamcinolone steroid cream to the rash twice daily for 5 days.  Apply sparingly.  Do not use more than 5 days.  Do not apply to face, abdomen, chest.  Please follow-up with your OB or PCP in 2 days for recheck.  Please go to the ER for any worsening symptoms.  I Hope you feel better soon!    ED Prescriptions     Medication Sig Dispense Auth. Provider   triamcinolone (KENALOG) 0.025 % ointment Apply 1 Application topically 2 (two) times daily. 30 g Radford Pax, NP      PDMP not reviewed this encounter.   Radford Pax, NP 07/22/23 [phone]

## 2023-07-22 NOTE — ED Triage Notes (Signed)
Pt presents with c/o bumps on the rt arm X 2 days.   States it itches and reports it her arm was swollen.

## 2023-07-24 LAB — OB RESULTS CONSOLE GBS: GBS: NEGATIVE

## 2023-08-11 ENCOUNTER — Other Ambulatory Visit: Payer: Self-pay

## 2023-08-11 ENCOUNTER — Encounter (HOSPITAL_COMMUNITY): Payer: Self-pay | Admitting: *Deleted

## 2023-08-11 ENCOUNTER — Inpatient Hospital Stay (HOSPITAL_COMMUNITY)
Admission: AD | Admit: 2023-08-11 | Discharge: 2023-08-11 | Disposition: A | Discharge status: Home / Self Care | Payer: Medicaid Other | Attending: Obstetrics & Gynecology | Admitting: Obstetrics & Gynecology

## 2023-08-11 DIAGNOSIS — Z3A39 39 weeks gestation of pregnancy: Secondary | ICD-10-CM | POA: Insufficient documentation

## 2023-08-11 DIAGNOSIS — Z0371 Encounter for suspected problem with amniotic cavity and membrane ruled out: Secondary | ICD-10-CM | POA: Diagnosis present

## 2023-08-11 NOTE — MAU Provider Note (Signed)
None      S: Ms. Tricia Wood is a 27 y.o. G1P0 at [redacted]w[redacted]d  who presents to MAU today complaining of leaking of fluid since early this morning. She has changed her underwear x 2 but has not required a pad for the leaking.  She denies vaginal bleeding. She denies contractions. She reports normal fetal movement.    O: BP 107/73 (BP Location: Right Arm)   Pulse (!) 109   Temp 97.7 F (36.5 C) (Oral)   Resp 18   Ht 5\' 1"  (1.549 m)   Wt 53 kg   LMP  (Approximate)   SpO2 99%   BMI 22.09 kg/m  GENERAL: Well-developed, well-nourished female in no acute distress.  HEAD: Normocephalic, atraumatic.  CHEST: Normal effort of breathing, regular heart rate ABDOMEN: Soft, nontender, gravid PELVIC: Normal external female genitalia. Vagina is pink and rugated. Cervix with normal contour, no lesions. Normal discharge.  negative pooling.   Cervical exam:    Cervix visually 1 cm  Negative ferning on slide  Fetal Monitoring: Baseline: 135 Variability: moderate Accelerations: present Decelerations: none Contractions: Q 2-3 min  No results found for this or any previous visit (from the past 24 hour(s)).   A: [facility] at [redacted]w[redacted]d  Membranes intact  P: Labor precautions given, f/u in office as scheduled  Hurshel Party, CNM 08/11/2023 1:42 PM

## 2023-08-11 NOTE — Discharge Instructions (Signed)
Reasons to return to MAU at [facility]:  1.  Contractions are  5 minutes apart or less, each last 1 minute, these have been going on for 1-2 hours, and you cannot walk or talk during them 2.  You have a large gush of fluid, or a trickle of fluid that will not stop and you have to wear a pad 3.  You have bleeding that is bright red, heavier than spotting--like menstrual bleeding (spotting can be normal in early labor or after a check of your cervix) 4.  You do not feel the baby moving like he/she normally does  

## 2023-08-11 NOTE — MAU Note (Signed)
Tricia Wood is a 27 y.o. at [redacted]w[redacted]d here in MAU reporting: she woke up this morning and bedding was wet.  States she's continues to leak and has changed undergarments twice.  Reports fluid is clear.  Denies VB.  Endorses +FM, but less than usual. LMP: NA Onset of complaint: today Pain score: 0 Vitals:   08/11/23 1135  BP: 107/73  Pulse: (!) 109  Resp: 18  Temp: 97.7 F (36.5 C)  SpO2: 99%     [id number] d/t maternal apparel Lab orders placed from triage:   None

## 2023-08-18 ENCOUNTER — Inpatient Hospital Stay (HOSPITAL_COMMUNITY): Payer: Medicaid Other | Admitting: Anesthesiology

## 2023-08-18 ENCOUNTER — Encounter (HOSPITAL_COMMUNITY): Payer: Self-pay | Admitting: Obstetrics

## 2023-08-18 ENCOUNTER — Other Ambulatory Visit: Payer: Self-pay

## 2023-08-18 ENCOUNTER — Inpatient Hospital Stay (HOSPITAL_COMMUNITY)
Admission: AD | Admit: 2023-08-18 | Discharge: 2023-08-21 | DRG: 807 | Disposition: A | Discharge status: Home / Self Care | Payer: Medicaid Other | Attending: Obstetrics and Gynecology | Admitting: Obstetrics and Gynecology

## 2023-08-18 DIAGNOSIS — Z3A40 40 weeks gestation of pregnancy: Secondary | ICD-10-CM | POA: Diagnosis not present

## 2023-08-18 DIAGNOSIS — K219 Gastro-esophageal reflux disease without esophagitis: Secondary | ICD-10-CM | POA: Diagnosis present

## 2023-08-18 DIAGNOSIS — O9902 Anemia complicating childbirth: Secondary | ICD-10-CM | POA: Diagnosis present

## 2023-08-18 DIAGNOSIS — O9962 Diseases of the digestive system complicating childbirth: Secondary | ICD-10-CM | POA: Diagnosis present

## 2023-08-18 DIAGNOSIS — O480 Post-term pregnancy: Principal | ICD-10-CM | POA: Diagnosis present

## 2023-08-18 HISTORY — DX: Other specified health status: Z78.9

## 2023-08-18 LAB — CBC
HCT: 40.3 % (ref 36.0–46.0)
Hemoglobin: 13.2 g/dL (ref 12.0–15.0)
MCH: 30.8 pg (ref 26.0–34.0)
MCHC: 32.8 g/dL (ref 30.0–36.0)
MCV: 93.9 fL (ref 80.0–100.0)
Platelets: 221 10*3/uL (ref 150–400)
RBC: 4.29 MIL/uL (ref 3.87–5.11)
RDW: 14.2 % (ref 11.5–15.5)
WBC: 7 10*3/uL (ref 4.0–10.5)
nRBC: 0 % (ref 0.0–0.2)

## 2023-08-18 LAB — TYPE AND SCREEN
ABO/RH(D): A POS
Antibody Screen: NEGATIVE

## 2023-08-18 MED ORDER — OXYTOCIN BOLUS FROM INFUSION
333.0000 mL | Freq: Once | INTRAVENOUS | Status: AC
  Administered 2023-08-19: 333 mL via INTRAVENOUS

## 2023-08-18 MED ORDER — EPHEDRINE 5 MG/ML INJ: 10.0000 mg | INTRAVENOUS | Status: DC | PRN

## 2023-08-18 MED ORDER — FENTANYL CITRATE (PF) 100 MCG/2ML IJ SOLN: 50.0000 ug | INTRAMUSCULAR | Status: DC | PRN

## 2023-08-18 MED ORDER — ACETAMINOPHEN 325 MG PO TABS
650.0000 mg | ORAL_TABLET | ORAL | Status: DC | PRN
  Administered 2023-08-19: 325 mg via ORAL
  Administered 2023-08-19: 650 mg via ORAL
  Filled 2023-08-18 (×2): qty 2

## 2023-08-18 MED ORDER — DIPHENHYDRAMINE HCL 50 MG/ML IJ SOLN: 12.5000 mg | INTRAMUSCULAR | Status: DC | PRN

## 2023-08-18 MED ORDER — TERBUTALINE SULFATE 1 MG/ML IJ SOLN: 0.2500 mg | Freq: Once | INTRAMUSCULAR | Status: DC | PRN

## 2023-08-18 MED ORDER — PHENYLEPHRINE 80 MCG/ML (10ML) SYRINGE FOR IV PUSH (FOR BLOOD PRESSURE SUPPORT): 80.0000 ug | PREFILLED_SYRINGE | INTRAVENOUS | Status: DC | PRN

## 2023-08-18 MED ORDER — LIDOCAINE HCL (PF) 1 % IJ SOLN
INTRAMUSCULAR | Status: DC | PRN
  Administered 2023-08-18: 5 mL via EPIDURAL
  Administered 2023-08-18: 4 mL via EPIDURAL

## 2023-08-18 MED ORDER — OXYTOCIN-SODIUM CHLORIDE 30-0.9 UT/500ML-% IV SOLN: 1.0000 m[IU]/min | INTRAVENOUS | Status: DC

## 2023-08-18 MED ORDER — OXYCODONE-ACETAMINOPHEN 5-325 MG PO TABS: 2.0000 | ORAL_TABLET | ORAL | Status: DC | PRN

## 2023-08-18 MED ORDER — SOD CITRATE-CITRIC ACID 500-334 MG/5ML PO SOLN: 30.0000 mL | ORAL | Status: DC | PRN

## 2023-08-18 MED ORDER — OXYCODONE-ACETAMINOPHEN 5-325 MG PO TABS: 1.0000 | ORAL_TABLET | ORAL | Status: DC | PRN

## 2023-08-18 MED ORDER — FENTANYL-BUPIVACAINE-NACL 0.5-0.125-0.9 MG/250ML-% EP SOLN
12.0000 mL/h | EPIDURAL | Status: DC | PRN
  Administered 2023-08-18: 11 mL/h via EPIDURAL
  Filled 2023-08-18: qty 250

## 2023-08-18 MED ORDER — LACTATED RINGERS IV SOLN: 500.0000 mL | INTRAVENOUS | Status: DC | PRN

## 2023-08-18 MED ORDER — OXYTOCIN-SODIUM CHLORIDE 30-0.9 UT/500ML-% IV SOLN
2.5000 [IU]/h | INTRAVENOUS | Status: DC
  Administered 2023-08-19: 2.5 [IU]/h via INTRAVENOUS
  Filled 2023-08-18: qty 500

## 2023-08-18 MED ORDER — ONDANSETRON HCL 4 MG/2ML IJ SOLN
4.0000 mg | Freq: Four times a day (QID) | INTRAMUSCULAR | Status: DC | PRN
  Administered 2023-08-19: 4 mg via INTRAVENOUS
  Filled 2023-08-18: qty 2

## 2023-08-18 MED ORDER — LIDOCAINE HCL (PF) 1 % IJ SOLN: 30.0000 mL | INTRAMUSCULAR | Status: DC | PRN

## 2023-08-18 MED ORDER — LACTATED RINGERS IV SOLN: INTRAVENOUS | Status: DC

## 2023-08-18 MED ORDER — LACTATED RINGERS IV SOLN: 500.0000 mL | Freq: Once | INTRAVENOUS | Status: DC

## 2023-08-18 NOTE — MAU Note (Signed)
.  Tricia Wood is a 27 y.o. at [redacted]w[redacted]d here in MAU reporting: ctx q 10 min since 1400.  Denies LOF, reports some bloody show. +FM  Onset of complaint: 1400 Pain score: 5 Vitals:   08/18/23 1816 08/18/23 1818  BP:  117/73  Pulse:  (!) 102  Resp:  17  Temp:  98.2 F (36.8 C)  SpO2: 100%      FHT:145 Lab orders placed from triage:

## 2023-08-18 NOTE — Anesthesia Procedure Notes (Signed)
Epidural Patient location during procedure: OB Start time: 08/18/2023 10:09 PM End time: 08/18/2023 10:19 PM  Staffing Anesthesiologist: Mal Amabile, MD Performed: anesthesiologist   Preanesthetic Checklist Completed: patient identified, IV checked, site marked, risks and benefits discussed, surgical consent, monitors and equipment checked, pre-op evaluation and timeout performed  Epidural Patient position: sitting Prep: DuraPrep and site prepped and draped Patient monitoring: continuous pulse ox and blood pressure Approach: midline Location: L3-L4 Injection technique: LOR air  Needle:  Needle type: Tuohy  Needle gauge: 17 G Needle length: 9 cm and 9 Needle insertion depth: 4 cm Catheter type: closed end flexible Catheter size: 19 Gauge Catheter at skin depth: 9 cm Test dose: negative and Other  Assessment Events: blood not aspirated, no cerebrospinal fluid, injection not painful, no injection resistance, no paresthesia and negative IV test  Additional Notes Patient identified. Risks and benefits discussed including failed block, incomplete  Pain control, post dural puncture headache, nerve damage, paralysis, blood pressure Changes, nausea, vomiting, reactions to medications-both toxic and allergic and post Partum back pain. All questions were answered. Patient expressed understanding and wished to proceed. Sterile technique was used throughout procedure. Epidural site was Dressed with sterile barrier dressing. No paresthesias, signs of intravascular injection Or signs of intrathecal spread were encountered.  Patient was more comfortable after the epidural was dosed. Please see RN's note for documentation of vital signs and FHR which are stable. Reason for block:procedure for pain

## 2023-08-18 NOTE — H&P (Signed)
27 y.o. G1P0 @ [redacted]w[redacted]d presents with contractions.  SVE on admission was 2 cm and was unchanged 1.5 hours later.  The patient is tearful with contractions and requests to stay for labor augmentation.  She declined a trial of therapeutic rest.  Otherwise has good fetal movement and no bleeding.  Pregnancy has been uncomplicated  Past Medical History:  Diagnosis Date   Allergy    Medical history non-contributory     Past Surgical History:  Procedure Laterality Date   NO PAST SURGERIES      OB History  Gravida Para Term Preterm AB Living  1            SAB IAB Ectopic Multiple Live Births               # Outcome Date GA Lbr Len/2nd Weight Sex Type Anes PTL Lv  1 Current             Social History   Socioeconomic History   Marital status: Single    Spouse name: Not on file   Number of children: Not on file   Years of education: Not on file   Highest education level: Not on file  Occupational History   Not on file  Tobacco Use   Smoking status: Never   Smokeless tobacco: Never  Vaping Use   Vaping status: Never Used  Substance and Sexual Activity   Alcohol use: No    Alcohol/week: 0.0 standard drinks of alcohol   Drug use: No   Sexual activity: Not Currently  Other Topics Concern   Not on file  Social History Narrative   Not on file   Social Determinants of Health   Financial Resource Strain: Not on file  Food Insecurity: Not on file  Transportation Needs: Not on file  Physical Activity: Not on file  Stress: Not on file  Social Connections: Not on file  Intimate Partner Violence: Not on file   Chicken allergy, Shrimp extract, and Shrimp [shellfish allergy]    Prenatal Transfer Tool  Maternal Diabetes: No Genetic Screening: Normal Maternal Ultrasounds/Referrals: Isolated EIF (echogenic intracardiac focus) Fetal Ultrasounds or other Referrals:  None Maternal Substance Abuse:  No Significant Maternal Medications:  None Significant Maternal Lab Results: Group B  Strep negative RSV Vaccine 07/14/23  ABO, Rh:  A+ Antibody:  Pending Rubella:  Immune RPR:   NR HBsAg:   Negative HIV:   Negative GBS:   Negative        Vitals:   08/18/23 1816 08/18/23 1818  BP:  117/73  Pulse:  (!) 102  Resp:  17  Temp:  98.2 F (36.8 C)  SpO2: 100%      General:  Painful with contractions Abdomen:  soft, gravid Ex:  no edema SVE:  2/70/-2 per MAU RN FHTs:  140s, moderate variability, + accelerations Toco:  q3-4 minutes   A/P   27 y.o. G1P0 [redacted]w[redacted]d presents with latent labor, requesting augmentation Augmentation of latent labor:  Patient requesting epidural now.  If no cervical change after epidural, will start pitocin GBS negative    Denaly Gatling GEFFEL Gurneet Matarese

## 2023-08-18 NOTE — Anesthesia Preprocedure Evaluation (Signed)
Anesthesia Evaluation  Patient identified by MRN, date of birth, ID band Patient awake    Reviewed: Allergy & Precautions, Patient's Chart, lab work & pertinent test results  Airway Mallampati: II  TM Distance: >3 FB     Dental no notable dental hx. (+) Teeth Intact   Pulmonary neg pulmonary ROS   Pulmonary exam normal breath sounds clear to auscultation       Cardiovascular negative cardio ROS Normal cardiovascular exam Rhythm:Regular Rate:Normal     Neuro/Psych negative neurological ROS  negative psych ROS   GI/Hepatic Neg liver ROS,GERD  Medicated,,  Endo/Other  negative endocrine ROS    Renal/GU negative Renal ROS  negative genitourinary   Musculoskeletal   Abdominal   Peds  Hematology  (+) Blood dyscrasia, anemia   Anesthesia Other Findings   Reproductive/Obstetrics (+) Pregnancy                             Anesthesia Physical Anesthesia Plan  ASA: 2  Anesthesia Plan: Epidural   Post-op Pain Management: Minimal or no pain anticipated   Induction: Intravenous  PONV Risk Score and Plan:   Airway Management Planned: Natural Airway  Additional Equipment: Fetal Monitoring and None  Intra-op Plan:   Post-operative Plan:   Informed Consent: I have reviewed the patients History and Physical, chart, labs and discussed the procedure including the risks, benefits and alternatives for the proposed anesthesia with the patient or authorized representative who has indicated his/her understanding and acceptance.       Plan Discussed with: Anesthesiologist  Anesthesia Plan Comments:        Anesthesia Quick Evaluation

## 2023-08-19 ENCOUNTER — Encounter (HOSPITAL_COMMUNITY): Payer: Self-pay | Admitting: Obstetrics and Gynecology

## 2023-08-19 LAB — RPR: RPR Ser Ql: NONREACTIVE

## 2023-08-19 MED ORDER — OXYCODONE HCL 5 MG PO TABS
5.0000 mg | ORAL_TABLET | ORAL | Status: DC | PRN
  Filled 2023-08-19: qty 1

## 2023-08-19 MED ORDER — METHYLERGONOVINE MALEATE 0.2 MG PO TABS: 0.2000 mg | ORAL_TABLET | ORAL | Status: DC | PRN

## 2023-08-19 MED ORDER — OXYCODONE HCL 5 MG PO TABS: 10.0000 mg | ORAL_TABLET | ORAL | Status: DC | PRN

## 2023-08-19 MED ORDER — DIBUCAINE (PERIANAL) 1 % EX OINT
1.0000 | TOPICAL_OINTMENT | CUTANEOUS | Status: DC | PRN
  Administered 2023-08-19: 1 via RECTAL
  Filled 2023-08-19: qty 28

## 2023-08-19 MED ORDER — ACETAMINOPHEN 325 MG PO TABS
650.0000 mg | ORAL_TABLET | ORAL | Status: DC | PRN
  Administered 2023-08-19: 650 mg via ORAL
  Filled 2023-08-19: qty 2

## 2023-08-19 MED ORDER — OXYTOCIN-SODIUM CHLORIDE 30-0.9 UT/500ML-% IV SOLN: 2.5000 [IU]/h | INTRAVENOUS | Status: DC | PRN

## 2023-08-19 MED ORDER — TETANUS-DIPHTH-ACELL PERTUSSIS 5-2.5-18.5 LF-MCG/0.5 IM SUSY: 0.5000 mL | PREFILLED_SYRINGE | Freq: Once | INTRAMUSCULAR | Status: DC

## 2023-08-19 MED ORDER — ZOLPIDEM TARTRATE 5 MG PO TABS: 5.0000 mg | ORAL_TABLET | Freq: Every evening | ORAL | Status: DC | PRN

## 2023-08-19 MED ORDER — METHYLERGONOVINE MALEATE 0.2 MG/ML IJ SOLN: 0.2000 mg | INTRAMUSCULAR | Status: DC | PRN

## 2023-08-19 MED ORDER — LACTATED RINGERS IV SOLN: Freq: Once | INTRAVENOUS | Status: DC

## 2023-08-19 MED ORDER — ONDANSETRON HCL 4 MG/2ML IJ SOLN: 4.0000 mg | INTRAMUSCULAR | Status: DC | PRN

## 2023-08-19 MED ORDER — DIPHENHYDRAMINE HCL 25 MG PO CAPS: 25.0000 mg | ORAL_CAPSULE | Freq: Four times a day (QID) | ORAL | Status: DC | PRN

## 2023-08-19 MED ORDER — SODIUM CHLORIDE 0.9 % IV SOLN
2.0000 g | Freq: Four times a day (QID) | INTRAVENOUS | Status: DC
  Administered 2023-08-19: 2 g via INTRAVENOUS
  Filled 2023-08-19: qty 2000

## 2023-08-19 MED ORDER — ONDANSETRON HCL 4 MG PO TABS: 4.0000 mg | ORAL_TABLET | ORAL | Status: DC | PRN

## 2023-08-19 MED ORDER — COCONUT OIL OIL: 1.0000 | TOPICAL_OIL | Status: DC | PRN

## 2023-08-19 MED ORDER — PRENATAL MULTIVITAMIN CH
1.0000 | ORAL_TABLET | Freq: Every day | ORAL | Status: DC
  Administered 2023-08-20 – 2023-08-21 (×2): 1 via ORAL
  Filled 2023-08-19 (×2): qty 1

## 2023-08-19 MED ORDER — HYDROCORT-PRAMOXINE (PERIANAL) 1-1 % EX FOAM
1.0000 | Freq: Two times a day (BID) | CUTANEOUS | Status: DC
  Administered 2023-08-19 – 2023-08-21 (×4): 1 via RECTAL
  Filled 2023-08-19: qty 10

## 2023-08-19 MED ORDER — GENTAMICIN SULFATE 40 MG/ML IJ SOLN
5.0000 mg/kg | INTRAVENOUS | Status: DC
  Administered 2023-08-19: 250 mg via INTRAVENOUS
  Filled 2023-08-19: qty 6.25

## 2023-08-19 MED ORDER — IBUPROFEN 600 MG PO TABS
600.0000 mg | ORAL_TABLET | Freq: Four times a day (QID) | ORAL | Status: DC
  Administered 2023-08-19 – 2023-08-21 (×8): 600 mg via ORAL
  Filled 2023-08-19 (×7): qty 1

## 2023-08-19 MED ORDER — WITCH HAZEL-GLYCERIN EX PADS
1.0000 | MEDICATED_PAD | CUTANEOUS | Status: DC | PRN
  Administered 2023-08-19: 1 via TOPICAL

## 2023-08-19 MED ORDER — BENZOCAINE-MENTHOL 20-0.5 % EX AERO
1.0000 | INHALATION_SPRAY | CUTANEOUS | Status: DC | PRN
  Administered 2023-08-19: 1 via TOPICAL
  Filled 2023-08-19: qty 56

## 2023-08-19 MED ORDER — SENNOSIDES-DOCUSATE SODIUM 8.6-50 MG PO TABS
2.0000 | ORAL_TABLET | Freq: Every day | ORAL | Status: DC
  Administered 2023-08-20 – 2023-08-21 (×2): 2 via ORAL
  Filled 2023-08-19 (×2): qty 2

## 2023-08-19 MED ORDER — SIMETHICONE 80 MG PO CHEW: 80.0000 mg | CHEWABLE_TABLET | ORAL | Status: DC | PRN

## 2023-08-19 NOTE — Lactation Note (Signed)
This note was copied from a baby's chart. Lactation Consultation Note  Patient Name: Tricia Wood [id number] Date: 08/19/2023 Age:25 hours Reason for consult: Initial assessment;Primapara;1st time breastfeeding;Term Nurse asked LC to see patient, mom had called.  As LC entered the room. Mom holding baby and she was asleep.  LC offered to check her diaper, and assist to latch.  Mom receptive. Diaper dry, place baby STS and baby was spitty of undigested formula x 2 . Baby then latched , no suck and fell asleep.  Baby STS with mom , with a blanket on the babies back.  LC reviewed BF goals for 24 hours - feed with feeding cues and by 3 hours offer the breast. LC recommended holding off on the formula and allow the baby to get hungry to BF.  LC reviewed the bulb syringe and burping.   Maternal Data Has patient been taught Hand Expression?: Yes Does the patient have breastfeeding experience prior to this delivery?: No  Feeding Mother's Current Feeding Choice: Breast Milk and Formula Nipple Type: Slow - flow  LATCH Score Latch: Repeated attempts needed to sustain latch, nipple held in mouth throughout feeding, stimulation needed to elicit sucking reflex.  Audible Swallowing: None (no suck, sleepy)  Type of Nipple: Everted at rest and after stimulation  Comfort (Breast/Nipple): Soft / non-tender  Hold (Positioning): Full assist, staff holds infant at breast  LATCH Score: 5   Lactation Tools Discussed/Used  None needed as this time   Interventions Interventions: Breast feeding basics reviewed;Assisted with latch;Skin to skin;Breast massage;Hand express;Adjust position;Support pillows;Position options;Education;LC Services brochure  Discharge Pump: DEBP;Personal (per mom Medela)  Consult Status Consult Status: Follow-up Date: 08/20/23 Follow-up type: In-patient    Matilde Sprang [name] 08/19/2023, 5:29 PM

## 2023-08-19 NOTE — Progress Notes (Signed)
ANTIBIOTIC CONSULT NOTE - INITIAL  Pharmacy Consult for Gentamicin Indication: Chorioamnionitis   Allergies  Allergen Reactions   Chicken Allergy Hives   Shrimp Extract Itching   Shrimp [Shellfish Allergy] Hives    Patient Measurements: Height: 5\' 1"  (154.9 cm) Weight: 53.7 kg (118 lb 4.8 oz) IBW/kg (Calculated) : 47.8 Adjusted Body Weight: 50.2 kg  Vital Signs: Temp: 100.4 F (38 C) (10/14 0756) Temp Source: Axillary (10/14 0756) BP: 102/58 (10/14 0732) Pulse Rate: 105 (10/14 0732)  Labs: Recent Labs    08/18/23 2123  WBC 7.0  HGB 13.2  PLT 221   No results for input(s): "GENTTROUGH", "GENTPEAK", "GENTRANDOM" in the last 72 hours.   Microbiology: Recent Results (from the past 720 hour(s))  OB RESULT CONSOLE Group B Strep     Status: None   Collection Time: 07/24/23 12:00 AM  Result Value Ref Range Status   GBS Negative  Final    Medications:  Ampicillin 2g IV q6h (10/14>>  Assessment: 27 y.o. female G1P0 at [redacted]w[redacted]d who presented with contractions in labor with new onset fever Tmax 100.4 and concern for chorioamnionitits.   Goal of Therapy:  Gentamicin peak 6-8 mg/L and Trough < 1 mg/L  Plan:  Gentamicin 250 mg (5 mg/kg) IV every 24 hrs  Check Scr with next labs if gentamicin continued. Will check gentamicin levels if continued > 72hr or clinically indicated.  Benetta Spar Tanaiya Kolarik 08/19/2023,8:26 AM

## 2023-08-19 NOTE — Lactation Note (Signed)
This note was copied from a baby's chart. Lactation Consultation Note  Patient Name: Tricia Wood [id number] Date: 08/19/2023 Age:25 hours Reason for consult: Follow-up assessment;Primapara;1st time breastfeeding;Term;Breastfeeding assistance;RN request  P1- RN stated that MOB requested assistance with breastfeeding. MOB states that infant hasn't latched since birth and she is too sleepy to wake up. LC explained that in the first 24 hrs, infants are sleepy but they still cannot go over 3 hrs without a feeding. LC assisted placing infant to the right breast in the cross cradle hold. Infant would not latch despite stimulation. LC recommended trying hand expression onto a spoon and MOB agreed.  LC demonstrated how to hand express and had MOB reciprocate. LC and MOB were able to express 2 mL EBM. MOB had rolling colostrum and much more could have been expressed, but after 5 minutes of expressing she stated her breast were sore. LC spoon fed infant the 2 mL EBM and she tolerated it well. After EBM, infant started rooting, so LC encouraged latching infant again.  With MOB's permission, LC was able to latch infant to the right breast in the cross cradle hold. Once infant was latched, LC praised MOB and encouraged her to keep infant on for as long as infant will stay on. LC encouraged MOB to call lactation for further support as needed.  Maternal Data Has patient been taught Hand Expression?: Yes Does the patient have breastfeeding experience prior to this delivery?: No  Feeding Mother's Current Feeding Choice: Breast Milk and Formula  LATCH Score Latch: Grasps breast easily, tongue down, lips flanged, rhythmical sucking.  Audible Swallowing: Spontaneous and intermittent  Type of Nipple: Everted at rest and after stimulation  Comfort (Breast/Nipple): Soft / non-tender  Hold (Positioning): Full assist, staff holds infant at breast  LATCH Score: 8   Lactation Tools Discussed/Used Pump  Education: Milk Storage  Interventions Interventions: Breast feeding basics reviewed;Assisted with latch;Hand express;Breast compression;Adjust position;Support pillows;Position options;Expressed milk;Education;LC Services brochure  Discharge Discharge Education: Warning signs for feeding baby Pump: DEBP;Personal (per mom Medela)  Consult Status Consult Status: Follow-up Date: 08/20/23 Follow-up type: In-patient    Dema Severin BS, IBCLC 08/19/2023, 8:17 PM

## 2023-08-19 NOTE — Progress Notes (Signed)
Resting comfortably  BP 105/66   Pulse (!) 113   Temp 98.3 F (36.8 C) (Oral)   Resp 16   Ht 5\' 1"  (1.549 m)   Wt 53.7 kg   LMP  (Approximate)   SpO2 100%   BMI 22.35 kg/m   Toco: q2-3 minutes EFM: 140s, moderate variability, + accels, category 1 SVE: 7/90/-1, AROM clear fluid  G1 @ [redacted]w[redacted]d with labor Anticipate SVD Pitocin as needed GBS negative

## 2023-08-20 LAB — CBC
HCT: 29.8 % — ABNORMAL LOW (ref 36.0–46.0)
Hemoglobin: 9.8 g/dL — ABNORMAL LOW (ref 12.0–15.0)
MCH: 31.2 pg (ref 26.0–34.0)
MCHC: 32.9 g/dL (ref 30.0–36.0)
MCV: 94.9 fL (ref 80.0–100.0)
Platelets: 161 10*3/uL (ref 150–400)
RBC: 3.14 MIL/uL — ABNORMAL LOW (ref 3.87–5.11)
RDW: 14.5 % (ref 11.5–15.5)
WBC: 10.7 10*3/uL — ABNORMAL HIGH (ref 4.0–10.5)
nRBC: 0 % (ref 0.0–0.2)

## 2023-08-20 NOTE — Progress Notes (Signed)
PPD #1 Some pain Afeb, VSS Fundus firm, NT at U-1 Continue routine postpartum care

## 2023-08-20 NOTE — Anesthesia Postprocedure Evaluation (Signed)
Anesthesia Post Note  Patient: Tricia Wood  Procedure(s) Performed: AN AD HOC LABOR EPIDURAL     Patient location during evaluation: Mother Baby Anesthesia Type: Epidural Level of consciousness: awake and alert and oriented Pain management: satisfactory to patient Vital Signs Assessment: post-procedure vital signs reviewed and stable Respiratory status: respiratory function stable Cardiovascular status: stable Postop Assessment: no headache, no backache, epidural receding, patient able to bend at knees, no signs of nausea or vomiting, adequate PO intake and able to ambulate Anesthetic complications: no   No notable events documented.  Last Vitals:  Vitals:   08/19/23 2325 08/20/23 0248  BP: 102/64 102/71  Pulse: 70 (!) 53  Resp: 18 18  Temp: 36.7 C (!) 36.4 C  SpO2:      Last Pain:  Vitals:   08/20/23 0549  TempSrc:   PainSc: 4    Pain Goal: Patients Stated Pain Goal: 0 (08/18/23 2300)                 Aaliya Maultsby

## 2023-08-21 LAB — SURGICAL PATHOLOGY

## 2023-08-21 MED ORDER — IBUPROFEN 800 MG PO TABS: 800.0000 mg | ORAL_TABLET | Freq: Three times a day (TID) | ORAL | 0 refills | Status: AC | PRN

## 2023-08-21 NOTE — Discharge Summary (Signed)
Postpartum Discharge Summary  Date of Service updated     Patient Name: Tricia Wood DOB: 01-02-1996 MRN: [id number]  Date of admission: 08/18/2023 Delivery date:08/19/2023 Delivering provider: Waynard Reeds Date of discharge: 08/21/2023  Admitting diagnosis: Prolonged latent phase of labor [O63.0] Spontaneous vaginal delivery [O80] Intrauterine pregnancy: [redacted]w[redacted]d     Secondary diagnosis:  Principal Problem:   Prolonged latent phase of labor Active Problems:   Spontaneous vaginal delivery  Additional problems: None    Discharge diagnosis: Term Pregnancy Delivered                                              Post partum procedures: N/A Augmentation: AROM and Pitocin Complications: None  Hospital course: Onset of Labor With Vaginal Delivery      27 y.o. yo G1P1001 at [redacted]w[redacted]d was admitted in Latent Labor on 08/18/2023. Labor course was complicated by nothing.  Membrane Rupture Time/Date: 6:01 AM,08/19/2023  Delivery Method:Vaginal, Spontaneous Operative Delivery:N/A Episiotomy:   Lacerations:  2nd degree;Perineal Patient had a postpartum course complicated by nothing.  She is ambulating, tolerating a regular diet, passing flatus, and urinating well. Patient is discharged home in stable condition on 08/21/23.  Newborn Data: Birth date:08/19/2023 Birth time:1:25 PM Gender:Female Living status:Living Apgars:8 ,9  Weight:3130 g  Magnesium Sulfate received: No BMZ received: No Rhophylac:No MMR:No T-DaP:Given prenatally Flu: No RSV Vaccine received: Yes Transfusion:No Immunizations administered: Immunization History  Administered Date(s) Administered   Influenza-Unspecified 07/10/2017    Physical exam  Vitals:   08/20/23 0248 08/20/23 1328 08/20/23 2059 08/21/23 0622  BP: 102/71 100/70 99/69 (!) 92/52  Pulse: (!) 53 79 73 61  Resp: 18 16 17 17   Temp: (!) 97.5 F (36.4 C) 98.2 F (36.8 C)    TempSrc: Oral Oral    SpO2:  100% 100% 98%  Weight:      Height:        General: alert and no distress Lochia: appropriate Uterine Fundus: firm Incision: N/A DVT Evaluation: No evidence of DVT seen on physical exam. No significant calf/ankle edema. Labs: Lab Results  Component Value Date   WBC 10.7 (H) 08/20/2023   HGB 9.8 (L) 08/20/2023   HCT 29.8 (L) 08/20/2023   MCV 94.9 08/20/2023   PLT 161 08/20/2023      Latest Ref Rng & Units 05/15/2021    3:56 PM  CMP  Glucose 65 - 99 mg/dL 91   BUN 6 - 20 mg/dL 9   Creatinine 4.09 - 8.11 mg/dL 9.14   Sodium 782 - 956 mmol/L 141   Potassium 3.5 - 5.2 mmol/L 4.1   Chloride 96 - 106 mmol/L 99   CO2 20 - 29 mmol/L 23   Calcium 8.7 - 10.2 mg/dL 9.8   Total Protein 6.0 - 8.5 g/dL 8.7   Total Bilirubin 0.0 - 1.2 mg/dL 0.4   Alkaline Phos 44 - 121 IU/L 53   AST 0 - 40 IU/L 17   ALT 0 - 32 IU/L 7    Edinburgh Score:    08/21/2023    8:13 AM  Edinburgh Postnatal Depression Scale Screening Tool  I have been able to laugh and see the funny side of things. 1  I have looked forward with enjoyment to things. 0  I have blamed myself unnecessarily when things went wrong. 1  I have been anxious or worried for  no good reason. 0  I have felt scared or panicky for no good reason. 1  Things have been getting on top of me. 2  I have been so unhappy that I have had difficulty sleeping. 1  I have felt sad or miserable. 1  I have been so unhappy that I have been crying. 1  The thought of harming myself has occurred to me. 0  Edinburgh Postnatal Depression Scale Total 8      Discharge home in stable condition Infant Feeding: Breast Infant Disposition:home with mother Discharge instruction: per After Visit Summary and Postpartum booklet. Activity: Advance as tolerated. Pelvic rest for 6 weeks.  Diet: routine diet Anticipated Birth Control: Unsure Postpartum Appointment:6 weeks Additional Postpartum F/U: Postpartum Depression checkup Future Appointments:No future appointments. Follow up Visit:  Follow-up  Information     [facility] Schedule an appointment as soon as possible for a visit in 6 week(s).   Contact information: [address]  [address] [place] [address] [phone]                     08/21/2023 Junius Creamer, DO

## 2023-08-21 NOTE — Progress Notes (Signed)
Post Partum Day 2 Subjective: up ad lib, voiding, and tolerating PO  Objective: Blood pressure (!) 92/52, pulse 61, temperature 98.2 F (36.8 C), temperature source Oral, resp. rate 17, height 5\' 1"  (1.549 m), weight 53.7 kg, SpO2 98%, unknown if currently breastfeeding.  Physical Exam:  General: alert, cooperative, and no distress Lochia: appropriate Uterine Fundus: firm Incision: N/A DVT Evaluation: No evidence of DVT seen on physical exam. No significant calf/ankle edema.  Recent Labs    08/18/23 2123 08/20/23 0450  HGB 13.2 9.8*  HCT 40.3 29.8*    Assessment/Plan: PPD#2 s/p NSVD  - DC home   LOS: 3 days   Junius Creamer, DO 08/21/2023, 10:58 AM

## 2023-08-21 NOTE — Discharge Instructions (Addendum)
Call office with any concerns [phone]

## 2023-08-24 ENCOUNTER — Inpatient Hospital Stay (HOSPITAL_COMMUNITY): Payer: Medicaid Other

## 2023-08-24 ENCOUNTER — Inpatient Hospital Stay (HOSPITAL_COMMUNITY)
Admission: RE | Admit: 2023-08-24 | Payer: Medicaid Other | Source: Home / Self Care | Admitting: Obstetrics and Gynecology

## 2023-09-11 ENCOUNTER — Telehealth (HOSPITAL_COMMUNITY): Payer: Self-pay | Admitting: *Deleted

## 2023-09-11 NOTE — Telephone Encounter (Signed)
09/11/2023  Name: Tricia Wood MRN: [id number] DOB: 11-02-1996  Reason for Call:  Transition of Care Hospital Discharge Call  Contact Status: Patient Contact Status: Message  Language assistant needed:          Follow-Up Questions:    Tricia Wood Postnatal Depression Scale:  In the Past 7 Days:    PHQ2-9 Depression Scale:     Discharge Follow-up:    Post-discharge interventions: NA  Salena Saner, RN 09/11/2023 15:09
